# Patient Record
Sex: Female | Born: 1980 | Race: White | Hispanic: No | Marital: Married | Smoking: Former smoker
Health system: Southern US, Community
[De-identification: ages and names within clinical notes are randomized; demographics above are authoritative.]

## PROBLEM LIST (undated history)

## (undated) ENCOUNTER — Ambulatory Visit

## (undated) DIAGNOSIS — G2581 Restless legs syndrome: Secondary | ICD-10-CM

## (undated) DIAGNOSIS — M1990 Unspecified osteoarthritis, unspecified site: Secondary | ICD-10-CM

## (undated) DIAGNOSIS — D649 Anemia, unspecified: Secondary | ICD-10-CM

## (undated) DIAGNOSIS — G5600 Carpal tunnel syndrome, unspecified upper limb: Secondary | ICD-10-CM

## (undated) DIAGNOSIS — R002 Palpitations: Secondary | ICD-10-CM

## (undated) DIAGNOSIS — K219 Gastro-esophageal reflux disease without esophagitis: Secondary | ICD-10-CM

## (undated) HISTORY — PX: CERVIX SURGERY: SHX593

---
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
## 2000-04-03 ENCOUNTER — Other Ambulatory Visit: Admission: RE | Admit: 2000-04-03 | Discharge: 2000-04-03 | Payer: Self-pay | Admitting: Obstetrics & Gynecology

## 2000-04-18 ENCOUNTER — Inpatient Hospital Stay (HOSPITAL_COMMUNITY): Admission: AD | Admit: 2000-04-18 | Discharge: 2000-04-22 | Payer: Self-pay | Admitting: Obstetrics & Gynecology

## 2000-04-20 ENCOUNTER — Encounter: Payer: Self-pay | Admitting: Obstetrics & Gynecology

## 2000-05-08 ENCOUNTER — Ambulatory Visit (HOSPITAL_COMMUNITY): Admission: RE | Admit: 2000-05-08 | Discharge: 2000-05-08 | Payer: Self-pay | Admitting: Obstetrics & Gynecology

## 2000-07-18 ENCOUNTER — Inpatient Hospital Stay (HOSPITAL_COMMUNITY): Admission: AD | Admit: 2000-07-18 | Discharge: 2000-07-20 | Payer: Self-pay | Admitting: Obstetrics and Gynecology

## 2001-02-24 ENCOUNTER — Encounter: Admission: RE | Admit: 2001-02-24 | Discharge: 2001-04-19 | Payer: Self-pay | Admitting: Family Medicine

## 2001-04-20 ENCOUNTER — Emergency Department (HOSPITAL_COMMUNITY): Admission: EM | Admit: 2001-04-20 | Discharge: 2001-04-20 | Payer: Self-pay | Admitting: Emergency Medicine

## 2001-05-04 ENCOUNTER — Emergency Department (HOSPITAL_COMMUNITY): Admission: EM | Admit: 2001-05-04 | Discharge: 2001-05-04 | Payer: Self-pay | Admitting: Emergency Medicine

## 2001-07-07 ENCOUNTER — Ambulatory Visit (HOSPITAL_COMMUNITY): Admission: RE | Admit: 2001-07-07 | Discharge: 2001-07-07 | Payer: Self-pay | Admitting: *Deleted

## 2001-07-07 ENCOUNTER — Encounter: Payer: Self-pay | Admitting: *Deleted

## 2002-11-15 ENCOUNTER — Emergency Department (HOSPITAL_COMMUNITY): Admission: EM | Admit: 2002-11-15 | Discharge: 2002-11-15 | Payer: Self-pay | Admitting: Emergency Medicine

## 2004-07-26 ENCOUNTER — Other Ambulatory Visit: Admission: RE | Admit: 2004-07-26 | Discharge: 2004-07-26 | Payer: Self-pay | Admitting: Family Medicine

## 2006-03-19 ENCOUNTER — Other Ambulatory Visit: Admission: RE | Admit: 2006-03-19 | Discharge: 2006-03-19 | Payer: Self-pay | Admitting: Family Medicine

## 2006-06-23 HISTORY — PX: CERVIX SURGERY: SHX593

## 2006-07-08 ENCOUNTER — Encounter (INDEPENDENT_AMBULATORY_CARE_PROVIDER_SITE_OTHER): Payer: Self-pay | Admitting: *Deleted

## 2006-07-08 ENCOUNTER — Ambulatory Visit (HOSPITAL_COMMUNITY): Admission: RE | Admit: 2006-07-08 | Discharge: 2006-07-08 | Payer: Self-pay | Admitting: Obstetrics and Gynecology

## 2008-01-21 ENCOUNTER — Other Ambulatory Visit: Admission: RE | Admit: 2008-01-21 | Discharge: 2008-01-21 | Payer: Self-pay | Admitting: Obstetrics and Gynecology

## 2009-05-24 ENCOUNTER — Other Ambulatory Visit: Admission: RE | Admit: 2009-05-24 | Discharge: 2009-05-24 | Payer: Self-pay | Admitting: Family Medicine

## 2010-11-08 NOTE — H&P (Signed)
Kendra [name], Kendra [name]             ACCOUNT NO.:  [id number]   MEDICAL RECORD NO.:  [id number]          PATIENT TYPE:  AMB   LOCATION:  SDC                           FACILITY:  [facility]   PHYSICIAN:  Charles A. Delcambre, MDDATE OF BIRTH:  10/10/1980   DATE OF ADMISSION:  DATE OF DISCHARGE:                              HISTORY & PHYSICAL   She is a 30 year old gravida 2, para 2-0-1-2, LMP April 27, 2006,  expecting her period at this time.  Surgery had been scheduled for  May 27, 2006, but this will likely be rescheduled secondary to her  expected period at that time.  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  She was originally referred from Dr. Laurann Montana for  colposcopic biopsy showing high grade dysplasia at 10 o'clock.  Colposcopy was done at the time of LEEP therapy here in the office, but  upon placement of paracervical block, she became intolerant of the  procedure and she is now to be admitted for cervical management under  anesthesia.   PAST MEDICAL HISTORY:  Panic disorder and anxiety.   PAST SURGICAL HISTORY:  C-section and VBAC.   MEDICATIONS:  Lorazepam 0.5 mg p.r.n., used two times per year, and  Diclofenac 75 mg p.r.n., used about two times per month.   ALLERGIES:  ALDARA VERSUS IODINE at the time of colposcopy, mainly felt  to be Aldara reaction.   SOCIAL HISTORY:  One pack per day of cigarettes.  No alcohol or drug  use.  Not sexually active currently.   FAMILY HISTORY:  No major illnesses.   REVIEW OF SYMPTOMS:  Denies fevers, chills, reactions to headaches,  dizziness, chest pain, shortness of breath, wheezing, diarrhea,  constipation, urgency, or frequency, or emotional changes.   PHYSICAL EXAMINATION:  GENERAL:  Alert and oriented x3 in no distress.  VITAL SIGNS:  Blood pressure 110/70, pulse 64, respirations 18, weight  157 pounds, height 5 feet 3 inches.  HEENT:   Grossly within normal limits.  NECK:  Supple without thyromegaly or adenopathy.  LUNGS:  Clear bilaterally.  HEART:  Regular rate and rhythm without murmurs, gallops, and rubs.  ABDOMEN:  Soft, nontender.  PELVIC:  Normal external female genitalia except for multiple less than  3-4 mm condylomata across the perineum, in the perineal body, around the  anus, and up in the superior part of the right labia majora/minora area.  The vagina has a polyp next to the cervix on the left sidewall  approximately 1.5 cm by 0.5 cm.  The cervix is multiparous.  The uterus  is not enlarged.  Adnexa  nontender.  Ovaries normal size and palpable  bilaterally.   ASSESSMENT:  1. CIN2.  2. Vulvar condyloma.  3. Vaginal polyp.   PLAN:  LEEP therapy, polyp resection, and laser vaporization of vulvar  condyloma.  The patient gives informed consent and accepts the risks of  infection, bleeding, bowel and bladder damage.  All questions were  answered.  She understands that condyloma may recur as well  as dysplasia  may recur.  She will remain n.p.o. past midnight the evening prior to  surgery and we will get, at the time of surgery, CBC and urine pregnancy  test.      Bing Neighbors. Sydnee Cabal, MD  Electronically Signed     CAD/MEDQ  D:  05/20/2006  T:  05/20/2006  Job:  [id number]   cc:   Stacie Acres. Cliffton Asters, M.D.  Fax: [phone]

## 2010-11-08 NOTE — Discharge Summary (Signed)
[facility]  Patient:    Kendra [name], Kendra [name]                      MRN: [id number] Adm. Date:  [id number] Disc. Date: [id number] Attending:  Mickle Mallory Dictator:   Leilani Able, P.A.                           Discharge Summary  FINAL DIAGNOSES:              1. Intrauterine pregnancy at [redacted] weeks gestation.                               2. Preterm contractions.  HISTORY OF PRESENT ILLNESS:   This 30 year old G2, P0-1-0-1, presented at ______  weeks with contractions.  The patients cervix was already dilated to 2-3 cm.  The patient had had a history of preterm premature rupture of membranes at 30 weeks with her last pregnancy.  At this point the patient was admitted for magnesium sulfate, Unasyn, and steroid protocol.  HOSPITAL COURSE:              By hospital day #1, the patient was feeling better.  She was not having any contractions.  Ultrasound did show a normal AFI and the cervix to be about 3.2 cm.  The patients hospital course continued to be stable.  She was tapered off her magnesium sulfate on hospital day #2 and was started on oral Procardia 10 mg every eight hours.  By hospital day #3, the patient was still doing well, without any contractions.  On exam, cervix internal os was closed, by Dr. Nita Sells exam, but on the ultrasound it did show that it was 3.2 cm long.  The patient was sent home on a regular diet, told to continue bed rest.  Was given Procardia 10 mg 1 t.i.d.  Follow up in the office in one week and to call if any contractions resumed. DD:  05/13/00 TD:  05/14/00 Job: [id number]

## 2010-11-08 NOTE — H&P (Signed)
Kendra [name], Kendra [name]             ACCOUNT NO.:  [id number]   MEDICAL RECORD NO.:  [id number]          PATIENT TYPE:  AMB   LOCATION:  SDC                           [facility]ACILITY:  [facility]   PHYSICIAN:  Charles A. Delcambre, MDDATE OF BIRTH:  04/23/1981   DATE OF ADMISSION:  07/08/2006  DATE OF DISCHARGE:                              HISTORY & PHYSICAL   This patient is to be admitted on July 08, 2006, to undergo LEEP  therapy and resection of a vaginal polyp.  We previously had planned to  laser condyloma in the vulva but surgery has been rescheduled and at  this point the vulvar condyloma have resolved spontaneously.  We will  therefore change her procedure to eliminate the laser to the vulva.  Dr.  Cliffton Asters initially did colposcopy and biopsy at 10 o'clock showing high  grade dysplasia.  She was to proceed with LEEP therapy here in the  office but upon placement of paracervical block, she became intolerant  of the procedure and she is now to be admitted for cervical dysplasia  management and vaginal polyp management under anesthesia.   PAST MEDICAL HISTORY:  Panic disorder and anxiety.   SURGICAL HISTORY:  1. C-section.  2. VBAC.   MEDICATIONS:  1. Lorazepam 0.5 mg p.r.n., used two times per year she states.  2. Diclofenac 75 mg p.r.n. used about two times per month.   ALLERGIES:  ALDARA VERSUS IODINE AT THE TIME OF COLPOSCOPY MAINLY FELT  TO BE ALDARA REACTION.   SOCIAL HISTORY:  One pack per day of cigarettes.  No alcohol or drug  use.  Not sexually active currently.   FAMILY HISTORY:  No major illnesses.   REVIEW OF SYSTEMS:  Denies fevers, chills, headaches, dizziness, chest  pain, shortness of breath, wheezing, diarrhea, constipation, urgency,  frequency, or emotional changes.  She recently had within the last month  a cold with cough and some diarrhea but these have all since resolved.   PHYSICAL EXAMINATION:  GENERAL:  Alert and oriented x3.  VITAL SIGNS:  Blood  pressure 100/70, heart rate 60, respirations 20,  weight 157.5 pounds, height 5 feet 3 inches.  HEENT:  Grossly within normal limits.  NECK:  Supple without thyromegaly or adenopathy.  LUNGS:  Clear bilaterally.  HEART:  Regular rate and rhythm without murmurs, gallops, or rubs.  ABDOMEN:  Soft and nontender.  PELVIC:  Normal external female genitalia with no condyloma at this  time.  The vagina has a polyp next to the cervix on the left side wall,  approximately 1.5 x 0.5-cm.  The cervix is multiparous.  The uterus is  not enlarged.  Adnexa nontender.  Ovaries normal size and palpable  bilaterally.   ASSESSMENT:  1. CIN-2.  2. Vaginal polyp.   PLAN:  LEEP therapy and polyp resection.  The patient gives informed  consent, accepts risks of infection, bleeding, bowel or bladder damage,  blood product risk including hepatitis and HIV exposure.  All questions  were answered.  She will remain NPO past 0700 on day of surgery with  surgery planned at 1500.  CBC and  urine pregnancy test will be done in  pre-op.      Charles A. Sydnee Cabal, MD  Electronically Signed     CAD/MEDQ  D:  07/06/2006  T:  07/06/2006  Job:  [id number]

## 2010-11-08 NOTE — Op Note (Signed)
NAMEFIORELLA, HANAHAN             ACCOUNT NO.:  [id number]   MEDICAL RECORD NO.:  [id number]          PATIENT TYPE:  AMB   LOCATION:  SDC                           [facility]ILITY:  [facility]   PHYSICIAN:  Charles A. Delcambre, MDDATE OF BIRTH:  [birth year]   DATE OF PROCEDURE:  07/08/2006  DATE OF DISCHARGE:                               OPERATIVE REPORT   PREOPERATIVE DIAGNOSES:  1. Cervical intraepithelial neoplasia II.  2. Vaginal polyp.   POSTOPERATIVE DIAGNOSES:  1. Cervical intraepithelial neoplasia II.  2. Vaginal polyp.   PROCEDURE:  1. Gait conization.  2. Vaginal polyp resection.  3. Paracervical block.   SURGEON:  Charles A. Delcambre, MD   ASSISTANT:  None.   COMPLICATIONS:  None.   ESTIMATED BLOOD LOSS:  Less than 10 cc.   SPECIMENS:  1. Vaginal polyp.  2. LEEP cone specimen labeled on cork.   ANESTHESIA:  Monitored anesthesia care with IV sedation.   COMPLICATIONS:  None.   DESCRIPTION OF PROCEDURE:  Patient was taken to the operating room,  placed in supine position.  She was prepared for anesthesia, placed in  dorsal lithotomy position in universal stirrups.  Anesthesia was given  for IV sedation.  A weighted speculum was placed in the vagina.  Lugol  solution was placed on the cervix.  The lesion could be seen.  The  cervix was bulbous.  Of note, I did expect to have to get the LEEP cone  and several specimens and/or pieces due to the size of the cervix and  not wanting to go too deeply in a single pass.  The paracervical block  was carried out at 3 and 9 o'clock, total of 10 cc injected.  A further  10 cc was injected at 10 and 2 o'clock.  The base of the vaginal polyp  on the left sidewall was injected with 0.25% Marcaine with epinephrine  as well.  Cervical injections with 0.25% Marcaine with epinephrine as  well.  Anesthesia was adequate.  LEEP cone was then taken with 50 watts  cutting and 50 watts coag.  The cone bed was cauterized with bulb  electrode.   Hemostasis was  excellent.  The base of the vaginal polyp was grasped and passing loop  over it, the polyp was resected with the cautery instrument as well.  Hemostasis was excellent.  All instruments were removed.  Patient was  taken to recovery with assistant in attendance, having tolerated  procedure well.      Charles A. Sydnee Cabal, MD  Electronically Signed     CAD/MEDQ  D:  07/08/2006  T:  07/08/2006  Job:  [id number]

## 2015-08-28 ENCOUNTER — Ambulatory Visit (INDEPENDENT_AMBULATORY_CARE_PROVIDER_SITE_OTHER): Payer: PRIVATE HEALTH INSURANCE | Admitting: Physician Assistant

## 2015-08-28 VITALS — BP 128/82 | HR 116 | Temp 97.9°F | Resp 18 | Ht 63.0 in | Wt 220.0 lb

## 2015-08-28 DIAGNOSIS — J101 Influenza due to other identified influenza virus with other respiratory manifestations: Secondary | ICD-10-CM

## 2015-08-28 DIAGNOSIS — R6889 Other general symptoms and signs: Secondary | ICD-10-CM | POA: Diagnosis not present

## 2015-08-28 LAB — POCT INFLUENZA A/B
Influenza A, POC: POSITIVE — AB
Influenza B, POC: NEGATIVE

## 2015-08-28 MED ORDER — HYDROCOD POLST-CPM POLST ER 10-8 MG/5ML PO SUER: 5.0000 mL | Freq: Two times a day (BID) | ORAL | Status: DC | PRN

## 2015-08-28 MED ORDER — GUAIFENESIN ER 1200 MG PO TB12: 1.0000 | ORAL_TABLET | Freq: Two times a day (BID) | ORAL | Status: AC

## 2015-08-28 MED ORDER — OSELTAMIVIR PHOSPHATE 75 MG PO CAPS: 75.0000 mg | ORAL_CAPSULE | Freq: Two times a day (BID) | ORAL | Status: AC

## 2015-08-28 NOTE — Patient Instructions (Signed)
Please push fluids.  Tylenol and Motrin for fever and body aches.    A humidifier can help especially when the air is dry -if you do not have a humidifier you can boil a pot of water on the stove in your home to help with the dry air.  

## 2015-08-28 NOTE — Progress Notes (Signed)
   Kendra [name]  MRN: [id number] DOB: [birth year]  Subjective:  Pt presents to clinic with 2 day h/o cold symptoms but yesterday morning the symptoms worsened and she feels terrible.  She has nasal congestion that started this am and is starting to drip down her throat.  Her cough continues but is dry and coming from her throat. She has a headache and myalgias and is not sleeping well at all.   Home treatment - nyquil and theraflu Sick contacts - family members Flu vaccine - no  There are no active problems to display for this patient.   No current outpatient prescriptions on file prior to visit.   No current facility-administered medications on file prior to visit.    No Known Allergies  Review of Systems  Constitutional: Positive for fever (Tmax 100.5) and chills.  HENT: Positive for congestion (just started today), postnasal drip and rhinorrhea. Negative for sore throat.   Respiratory: Positive for cough (clear). Negative for shortness of breath and wheezing.   Gastrointestinal: Negative.   Musculoskeletal: Positive for myalgias.  Neurological: Positive for headaches.   Objective:  BP 128/82 mmHg  Pulse 116  Temp(Src) 97.9 F (36.6 C) (Oral)  Resp 18  Ht 5\' 3"  (1.6 m)  Wt 220 lb (99.791 kg)  BMI 38.98 kg/m2  SpO2 98%  LMP 08/01/2015  Physical Exam  Constitutional: She is oriented to person, place, and time and well-developed, well-nourished, and in no distress.  HENT:  Head: Normocephalic and atraumatic.  Right Ear: Hearing, tympanic membrane, external ear and ear canal normal.  Left Ear: Hearing, tympanic membrane, external ear and ear canal normal.  Nose: Nose normal.  Mouth/Throat: Uvula is midline, oropharynx is clear and moist and mucous membranes are normal.  Eyes: Conjunctivae are normal.  Neck: Normal range of motion.  Cardiovascular: Normal rate, regular rhythm and normal heart sounds.   No murmur heard. Pulmonary/Chest: Effort normal and breath  sounds normal.  Neurological: She is alert and oriented to person, place, and time. Gait normal.  Skin: Skin is warm and dry.  Psychiatric: Mood, memory, affect and judgment normal.  Vitals reviewed.   Results for orders placed or performed in visit on 08/28/15  POCT Influenza A/B  Result Value Ref Range   Influenza A, POC Positive (A) Negative   Influenza B, POC Negative Negative    Assessment and Plan :  Flu-like symptoms - Plan: POCT Influenza A/B  Influenza A - Plan: Guaifenesin (MUCINEX MAXIMUM STRENGTH) 1200 MG TB12, chlorpheniramine-HYDROcodone (TUSSIONEX PENNKINETIC ER) 10-8 MG/5ML SUER, oseltamivir (TAMIFLU) 75 MG capsule, Care order/instruction   Symptomatic care d/w pt.  Benny LennertSarah Weber PA-C  [facility] 08/28/2015 12:42 PM

## 2016-01-08 ENCOUNTER — Encounter: Payer: Self-pay | Admitting: Family Medicine

## 2016-01-08 ENCOUNTER — Ambulatory Visit (INDEPENDENT_AMBULATORY_CARE_PROVIDER_SITE_OTHER): Payer: PRIVATE HEALTH INSURANCE | Admitting: Family Medicine

## 2016-01-08 VITALS — BP 125/84 | HR 76 | Resp 19 | Ht 62.25 in | Wt 220.0 lb

## 2016-01-08 DIAGNOSIS — K219 Gastro-esophageal reflux disease without esophagitis: Secondary | ICD-10-CM | POA: Insufficient documentation

## 2016-01-08 DIAGNOSIS — A630 Anogenital (venereal) warts: Secondary | ICD-10-CM | POA: Insufficient documentation

## 2016-01-08 DIAGNOSIS — K5909 Other constipation: Secondary | ICD-10-CM

## 2016-01-08 DIAGNOSIS — A6000 Herpesviral infection of urogenital system, unspecified: Secondary | ICD-10-CM

## 2016-01-08 DIAGNOSIS — K589 Irritable bowel syndrome without diarrhea: Secondary | ICD-10-CM

## 2016-01-08 DIAGNOSIS — Z8742 Personal history of other diseases of the female genital tract: Secondary | ICD-10-CM | POA: Insufficient documentation

## 2016-01-08 DIAGNOSIS — Z862 Personal history of diseases of the blood and blood-forming organs and certain disorders involving the immune mechanism: Secondary | ICD-10-CM

## 2016-01-08 DIAGNOSIS — F17200 Nicotine dependence, unspecified, uncomplicated: Secondary | ICD-10-CM

## 2016-01-08 DIAGNOSIS — B977 Papillomavirus as the cause of diseases classified elsewhere: Secondary | ICD-10-CM | POA: Diagnosis not present

## 2016-01-08 DIAGNOSIS — K5900 Constipation, unspecified: Secondary | ICD-10-CM | POA: Insufficient documentation

## 2016-01-08 MED ORDER — RANITIDINE HCL 150 MG PO TABS: ORAL_TABLET | ORAL | Status: DC

## 2016-01-08 NOTE — Assessment & Plan Note (Signed)
Patient not sure she is ready to quit smoking but we discussed medications to aid her, advised she go to the American heart association website and read about smoking cessation and tobacco use. She will read through this and we will discuss what her thoughts are and what she feel would be the best options for her to quit in the near future.

## 2016-01-08 NOTE — Assessment & Plan Note (Addendum)
Would like patient to keep a food diary and when her symptoms occur right it down. The future we will discuss 5 map diets and other possible treatments. Touched upon importance of fiber and increasing water intake as well as exercise to improve bowel movement.  Handouts provided

## 2016-01-08 NOTE — Assessment & Plan Note (Signed)
Patient night determine that is best to obtain some labs in the next office visit discuss her goals and possible plans of how to achieve them.

## 2016-01-08 NOTE — Assessment & Plan Note (Signed)
Was not aware of foods and dietary changes to prevent reflux. This was reviewed with patient today and handouts were provided.

## 2016-01-08 NOTE — Progress Notes (Signed)
Kendra [name], D.O. Primary care at [facility]   Subjective:    Chief Complaint  Patient presents with  . Establish Care   New pt, here to establish care.   HPI: Kendra [name] is a pleasant 35 y.o. female who presents to [facility] today to Become established.    Her review of systems is negative but she would like to discuss smoking cess, her weight, sleep, IBS Sx today  It's been many, many years and she's been to a doctor other than occasional urgent care if she 6.  She's been married 7 years and is monogamous with her husband. They both test positive for genital warts.  Patient does not have an outbreak but husband does several times a year.    Tobacco abuse:  1/2-1 pack a day for 13 years. Not ready to quit- or she's not sure if she has but would like to further discuss all of her options  Fe def  during 1st pregnancy- has been ever since she thinks-but blod work in long time.   No GYN.  Pap- done many many yrs ago.   LEEP procedure- for ABN cells; back in 2007.    IBS-   sx comes and goes, much more consitpation then D, occ N, no V. BLoating, occ crampy abd pain. Going on many yrs.  Never sought medical help.  GERD-  ranititdine most days, occ bid.  No special diet- not sure what she can and cannot eat.     morbidly obese:  Patient states she's been obese most of her life and this she finds very upsetting. She is worried that she possibly has lab abnormalities causing this such as thyroid abnormalities she would like lab work today    History reviewed. No pertinent past medical history.   Past Surgical History  Procedure Laterality Date  . Cesarean section    . Cervix surgery      removal of polyps and abnormal cells    Family History  Problem Relation Age of Onset  . Hyperlipidemia Father   . Hypertension Father   . Cancer Maternal Grandmother     Lung and Brain  . Cancer Maternal Grandfather     lung  .  Hyperlipidemia Maternal Grandfather   . Diabetes Paternal Grandmother   . Diabetes Paternal Grandfather   . Hyperlipidemia Paternal Grandfather       History  Drug Use No  ,    History  Alcohol Use: Not on file  ,    History  Smoking status  . Current Every Day Smoker -- 0.50 packs/day for 17 years  Smokeless tobacco  . Never Used  ,     History  Sexual Activity  . Sexual Activity: Yes      Patient's Medications  New Prescriptions   No medications on file  Previous Medications   IBUPROFEN (ADVIL,MOTRIN) 400 MG TABLET    Take 400 mg by mouth every 6 (six) hours as needed.   IRON, FERROUS SULFATE, PO    Take by mouth.   VITAMIN C (ASCORBIC ACID) 500 MG TABLET    Take 500 mg by mouth daily.  Modified Medications   No medications on file  Discontinued Medications   CHLORPHENIRAMINE-HYDROCODONE (TUSSIONEX PENNKINETIC ER) 10-8 MG/5ML SUER    Take 5 mLs by mouth 2 (two) times daily as needed for cough.     Review of patient's allergies indicates no known  allergies. Outpatient Encounter Prescriptions as of 01/08/2016  Medication Sig  . ibuprofen (ADVIL,MOTRIN) 400 MG tablet Take 400 mg by mouth every 6 (six) hours as needed.  . IRON, FERROUS SULFATE, PO Take by mouth.  . vitamin C (ASCORBIC ACID) 500 MG tablet Take 500 mg by mouth daily.  . [DISCONTINUED] chlorpheniramine-HYDROcodone (TUSSIONEX PENNKINETIC ER) 10-8 MG/5ML SUER Take 5 mLs by mouth 2 (two) times daily as needed for cough.   No facility-administered encounter medications on file as of 01/08/2016.    Fall Risk  08/28/2015  Falls in the past year? No     Depression screen [facility] 08/28/2015  Decreased Interest 0  PHQ - 2 Score 0      Review of Systems:   ( Completed via Adult Medical History Intake form today ) General:   Denies fever, chills, appetite changes, unexplained weight loss.  Optho/Auditory:   Denies visual changes, blurred vision/LOV, ringing in ears/ diff hearing Respiratory:    Denies SOB, DOE, cough, wheezing.  Cardiovascular:   Denies chest pain, palpitations, new onset peripheral edema  Gastrointestinal:   Denies nausea, vomiting, diarrhea.  Genitourinary:    Denies dysuria, increased frequency, flank pain.  Endocrine:     Denies hot or cold intolerance, polyuria, polydipsia. Musculoskeletal:  Denies unexplained myalgias, joint swelling, arthralgias, gait problems.  Skin:  Denies rash, suspicious lesions or new/ changes in moles Neurological:    Denies dizziness, syncope, unexplained weakness, lightheadedness, numbness  Psychiatric/Behavioral:   Denies mood changes, suicidal or homicidal ideations, hallucinations    Objective:   Blood pressure 125/84, pulse 76, resp. rate 19, height 5' 2.25" (1.581 m), weight 220 lb (99.791 kg), SpO2 99 %. Body mass index is 39.92 kg/(m^2).  General: Well Developed, well nourished, and in no acute distress.  Neuro: Alert and oriented x3, extra-ocular muscles intact, sensation grossly intact.  HEENT: Normocephalic, atraumatic, pupils equal round reactive to light, neck supple, no gross masses, no carotid bruits, no JVD apprec Skin: no gross suspicious lesions or rashes  Cardiac: Regular rate and rhythm, no murmurs rubs or gallops.  Respiratory: Essentially clear to auscultation bilaterally. Not using accessory muscles, speaking in full sentences.  Abdominal: Soft, not grossly distended Musculoskeletal: Ambulates w/o diff, FROM * 4 ext.  Vasc: less 2 sec cap RF, warm and pink  Psych:  No HI/SI, judgement and insight good.    Impression and Recommendations:    The patient was counseled, risk factors were discussed, anticipatory guidance given.  Irritable bowel syndrome (IBS) Would like patient to keep a food diary and when her symptoms occur right it down. The future we will discuss 5 map diets and other possible treatments. Touched upon importance of fiber and increasing water intake as well as exercise to improve bowel  movement.  Handouts provided  GERD (gastroesophageal reflux disease) Was not aware of foods and dietary changes to prevent reflux. This was reviewed with patient today and handouts were provided.  Morbidly obese (HCC) Patient night determine that is best to obtain some labs in the next office visit discuss her goals and possible plans of how to achieve them.  Tobacco use disorder Patient not sure she is ready to quit smoking but we discussed medications to aid her, advised she go to the American heart association website and read about smoking cessation and tobacco use. She will read through this and we will discuss what her thoughts are and what she feel would be the best options for her to quit in the  near future.   I did not order these below meds, they were just recorded by the CMA today as patient is taking them regularly Meds ordered this encounter  Medications  . IRON, FERROUS SULFATE, PO    Sig: Take by mouth.  . vitamin C (ASCORBIC ACID) 500 MG tablet    Sig: Take 500 mg by mouth daily.    Gross side effects, risk and benefits, and alternatives of medications discussed with patient.  Patient is aware that all medications have potential side effects and we are unable to predict every side effect or drug-drug interaction that may occur.  Expresses verbal understanding and consents to current therapy plan and treatment regimen.  Please see AVS handed out to patient at the end of our visit for further patient instructions/ counseling done pertaining to today's office visit.   Orders Placed This Encounter  Procedures  . CBC with Differential/Platelet  . COMPLETE METABOLIC PANEL WITH GFR  . Lipid panel  . VITAMIN D 25 Hydroxy (Vit-D Deficiency, Fractures)  . Hemoglobin A1c  . TSH    Note: This document was prepared using Dragon voice recognition software and may include unintentional dictation errors.

## 2016-01-08 NOTE — Patient Instructions (Addendum)
Take your ranitidine daily for your heartburn\reflex     Keep a track of exactly what you eat and what when your symptoms are.       Food Choices for Gastroesophageal Reflux Disease, Adult When you have gastroesophageal reflux disease (GERD), the foods you eat and your eating habits are very important. Choosing the right foods can help ease the discomfort of GERD. WHAT GENERAL GUIDELINES DO I NEED TO FOLLOW?  Choose fruits, vegetables, whole grains, low-fat dairy products, and low-fat meat, fish, and poultry.  Limit fats such as oils, salad dressings, butter, nuts, and avocado.  Keep a food diary to identify foods that cause symptoms.  Avoid foods that cause reflux. These may be different for different people.  Eat frequent small meals instead of three large meals each day.  Eat your meals slowly, in a relaxed setting.  Limit fried foods.  Cook foods using methods other than frying.  Avoid drinking alcohol.  Avoid drinking large amounts of liquids with your meals.  Avoid bending over or lying down until 2-3 hours after eating. WHAT FOODS ARE NOT RECOMMENDED? The following are some foods and drinks that may worsen your symptoms: Vegetables Tomatoes. Tomato juice. Tomato and spaghetti sauce. Chili peppers. Onion and garlic. Horseradish. Fruits Oranges, grapefruit, and lemon (fruit and juice). Meats High-fat meats, fish, and poultry. This includes hot dogs, ribs, ham, sausage, salami, and bacon. Dairy Whole milk and chocolate milk. Sour cream. Cream. Butter. Ice cream. Cream cheese.  Beverages Coffee and tea, with or without caffeine. Carbonated beverages or energy drinks. Condiments Hot sauce. Barbecue sauce.  Sweets/Desserts Chocolate and cocoa. Donuts. Peppermint and spearmint. Fats and Oils High-fat foods, including [place] fries and potato chips. Other Vinegar. Strong spices, such as black pepper, white pepper, red pepper, cayenne, curry powder, cloves, ginger,  and chili powder. The items listed above may not be a complete list of foods and beverages to avoid. Contact your dietitian for more information.   This information is not intended to replace advice given to you by your health care provider. Make sure you discuss any questions you have with your health care provider.   Document Released: 06/09/2005 Document Revised: 06/30/2014 Document Reviewed: 04/13/2013 Elsevier Interactive Patient Education 2016 Elsevier Inc.        Diet for Irritable Bowel Syndrome When you have irritable bowel syndrome (IBS), the foods you eat and your eating habits are very important. IBS may cause various symptoms, such as abdominal pain, constipation, or diarrhea. Choosing the right foods can help ease discomfort caused by these symptoms. Work with your health care provider and dietitian to find the best eating plan to help control your symptoms. WHAT GENERAL GUIDELINES DO I NEED TO FOLLOW?  Keep a food diary. This will help you identify foods that cause symptoms. Write down:  What you eat and when.  What symptoms you have.  When symptoms occur in relation to your meals.  Avoid foods that cause symptoms. Talk with your dietitian about other ways to get the same nutrients that are in these foods.  Eat more foods that contain fiber. Take a fiber supplement if directed by your dietitian.  Eat your meals slowly, in a relaxed setting.  Aim to eat 5-6 small meals per day. Do not skip meals.  Drink enough fluids to keep your urine clear or pale yellow.  Ask your health care provider if you should take an over-the-counter probiotic during flare-ups to help restore healthy gut bacteria.  If you have  cramping or diarrhea, try making your meals low in fat and high in carbohydrates. Examples of carbohydrates are pasta, rice, whole grain breads and cereals, fruits, and vegetables.  If dairy products cause your symptoms to flare up, try eating less of them. You  might be able to handle yogurt better than other dairy products because it contains bacteria that help with digestion. WHAT FOODS ARE NOT RECOMMENDED? The following are some foods and drinks that may worsen your symptoms:  Fatty foods, such as [place] fries.  Milk products, such as cheese or ice cream.  Chocolate.  Alcohol.  Products with caffeine, such as coffee.  Carbonated drinks, such as soda. The items listed above may not be a complete list of foods and beverages to avoid. Contact your dietitian for more information. WHAT FOODS ARE GOOD SOURCES OF FIBER? Your health care provider or dietitian may recommend that you eat more foods that contain fiber. Fiber can help reduce constipation and other IBS symptoms. Add foods with fiber to your diet a little at a time so that your body can get used to them. Too much fiber at once might cause gas and swelling of your abdomen. The following are some foods that are good sources of fiber:  Apples.  Peaches.  Pears.  Berries.  Figs.  Broccoli (raw).  Cabbage.  Carrots.  Raw peas.  Kidney beans.  Lima beans.  Whole grain bread.  Whole grain cereal. FOR MORE INFORMATION  International Foundation for Functional Gastrointestinal Disorders: www.iffgd.[employer] Diabetes and Digestive and Kidney Diseases: http://norris-lawson.com/.aspx   This information is not intended to replace advice given to you by your health care provider. Make sure you discuss any questions you have with your health care provider.   Document Released: 08/30/2003 Document Revised: 06/30/2014 Document Reviewed: 09/09/2013 Elsevier Interactive Patient Education [employer]

## 2016-01-16 ENCOUNTER — Other Ambulatory Visit: Payer: PRIVATE HEALTH INSURANCE

## 2016-01-23 ENCOUNTER — Other Ambulatory Visit (INDEPENDENT_AMBULATORY_CARE_PROVIDER_SITE_OTHER): Payer: PRIVATE HEALTH INSURANCE

## 2016-01-23 DIAGNOSIS — K5909 Other constipation: Secondary | ICD-10-CM

## 2016-01-23 DIAGNOSIS — K589 Irritable bowel syndrome without diarrhea: Secondary | ICD-10-CM

## 2016-01-23 DIAGNOSIS — Z862 Personal history of diseases of the blood and blood-forming organs and certain disorders involving the immune mechanism: Secondary | ICD-10-CM | POA: Diagnosis not present

## 2016-01-23 LAB — POCT GLYCOSYLATED HEMOGLOBIN (HGB A1C): Hemoglobin A1C: 5.6

## 2016-01-24 LAB — CBC WITH DIFFERENTIAL/PLATELET
BASOS PCT: 1 %
Basophils Absolute: 83 cells/uL (ref 0–200)
EOS ABS: 166 {cells}/uL (ref 15–500)
Eosinophils Relative: 2 %
HEMATOCRIT: 42.7 % (ref 35.0–45.0)
HEMOGLOBIN: 14 g/dL (ref 11.7–15.5)
LYMPHS ABS: 2075 {cells}/uL (ref 850–3900)
LYMPHS PCT: 25 %
MCH: 29.2 pg (ref 27.0–33.0)
MCHC: 32.8 g/dL (ref 32.0–36.0)
MCV: 89 fL (ref 80.0–100.0)
MONO ABS: 664 {cells}/uL (ref 200–950)
MPV: 12.3 fL (ref 7.5–12.5)
Monocytes Relative: 8 %
NEUTROS ABS: 5312 {cells}/uL (ref 1500–7800)
Neutrophils Relative %: 64 %
PLATELETS: 217 10*3/uL (ref 140–400)
RBC: 4.8 MIL/uL (ref 3.80–5.10)
RDW: 13.8 % (ref 11.0–15.0)
WBC: 8.3 10*3/uL (ref 3.8–10.8)

## 2016-01-24 LAB — VITAMIN D 25 HYDROXY (VIT D DEFICIENCY, FRACTURES): Vit D, 25-Hydroxy: 19 ng/mL — ABNORMAL LOW (ref 30–100)

## 2016-01-24 LAB — COMPLETE METABOLIC PANEL WITH GFR
ALT: 15 U/L (ref 6–29)
AST: 16 U/L (ref 10–30)
Albumin: 3.7 g/dL (ref 3.6–5.1)
Alkaline Phosphatase: 97 U/L (ref 33–115)
BUN: 12 mg/dL (ref 7–25)
CHLORIDE: 107 mmol/L (ref 98–110)
CO2: 23 mmol/L (ref 20–31)
CREATININE: 0.75 mg/dL (ref 0.50–1.10)
Calcium: 9.1 mg/dL (ref 8.6–10.2)
GFR, Est Non African American: >89 mL/min (ref >=60)
Glucose, Bld: 93 mg/dL (ref 65–99)
Potassium: 4.9 mmol/L (ref 3.5–5.3)
Sodium: 137 mmol/L (ref 135–146)
Total Bilirubin: 0.3 mg/dL (ref 0.2–1.2)
Total Protein: 6.9 g/dL (ref 6.1–8.1)

## 2016-01-24 LAB — LIPID PANEL
Cholesterol: 178 mg/dL (ref 125–200)
HDL: 41 mg/dL — AB (ref >=46)
LDL CALC: 101 mg/dL (ref <130)
TRIGLYCERIDES: 182 mg/dL — AB (ref <150)
Total CHOL/HDL Ratio: 4.3 Ratio (ref <=5.0)
VLDL: 36 mg/dL — AB (ref <30)

## 2016-01-24 LAB — TSH: TSH: 1.7 mIU/L

## 2016-01-26 NOTE — Progress Notes (Signed)
Please inform patient that there are abnormal lab values that we need discuss in person and devise a game plan together of how to improve them.  This should be done in the very near future.   Please have him/her make a follow-up appointment with me at their earliest convenience if they don't already have one.  Thanks, Dr Val Eagle

## 2016-02-04 ENCOUNTER — Encounter: Payer: Self-pay | Admitting: Family Medicine

## 2016-02-04 ENCOUNTER — Ambulatory Visit (INDEPENDENT_AMBULATORY_CARE_PROVIDER_SITE_OTHER): Payer: PRIVATE HEALTH INSURANCE | Admitting: Family Medicine

## 2016-02-04 VITALS — BP 128/89 | HR 80 | Wt 221.4 lb

## 2016-02-04 DIAGNOSIS — E786 Lipoprotein deficiency: Secondary | ICD-10-CM

## 2016-02-04 DIAGNOSIS — R0602 Shortness of breath: Secondary | ICD-10-CM | POA: Diagnosis not present

## 2016-02-04 DIAGNOSIS — F17200 Nicotine dependence, unspecified, uncomplicated: Secondary | ICD-10-CM

## 2016-02-04 DIAGNOSIS — R5383 Other fatigue: Secondary | ICD-10-CM

## 2016-02-04 DIAGNOSIS — E781 Pure hyperglyceridemia: Secondary | ICD-10-CM

## 2016-02-04 DIAGNOSIS — R002 Palpitations: Secondary | ICD-10-CM

## 2016-02-04 DIAGNOSIS — Z862 Personal history of diseases of the blood and blood-forming organs and certain disorders involving the immune mechanism: Secondary | ICD-10-CM

## 2016-02-04 DIAGNOSIS — R5381 Other malaise: Secondary | ICD-10-CM

## 2016-02-04 DIAGNOSIS — E559 Vitamin D deficiency, unspecified: Secondary | ICD-10-CM

## 2016-02-04 MED ORDER — VITAMIN D3 125 MCG (5000 UT) PO TABS: ORAL_TABLET | ORAL | 3 refills | Status: AC

## 2016-02-04 MED ORDER — VITAMIN D (ERGOCALCIFEROL) 1.25 MG (50000 UNIT) PO CAPS: 50000.0000 [IU] | ORAL_CAPSULE | ORAL | 10 refills | Status: DC

## 2016-02-04 NOTE — Progress Notes (Signed)
Assessment and plan:  1. Subjective Heart Palpitations   2. Other fatigue   3. SOB (shortness of breath)   4. 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 Vitamin D deficiency   6. Tobacco use disorder   7. Morbid obesity, unspecified obesity type ([place])   8. 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 We will recheck her cholesterol in 1 year  Handouts provided  Vitamin D deficiency Supplementation recommended  Education provided  Tobacco use disorder Advised to go to: https://wilkins.info/  Not sure she is ready to quit.  Education done and handouts provided   Palpitations Patient not very reassured by normal EKG in the office today which I reviewed with her.  Advised to avoid stimulants such as caffeine, nicotine   After risks benefits and discussion of possible further workup, patient wants a 48 hour Holter monitor.  She understands it may be difficult to capture irregular heartbeats during limited time period but still would like the evaluation.  Discussed stress management and deep breathing techniques to do when she has these feelings.  Other fatigue Reassured that all recent lab work, including TSH, looks good with the exception of what is noted.  She understands is no laboratory reason for her fatigue  We discussed the physiologic effects of deconditioning and morbid obesity on the body.   Counseling done. Advised lifestyle modifications   SOB (shortness of breath) Patient not stressed anymore than usual, denies feeling physical stress symptoms.  I did explain that feeling heart racing/  shortness of breath etc can be symptoms of anxiety.   Advised she  keep a log of her current emotions at the time she has the "palpitations", to see possible relation between the emotional and physical.   History of iron deficiency anemia Stable, no signs iron deficiency anemia.   Recommend to recheck vitamin D and hemoglobin A1c in 6 months  Patient's Medications  New Prescriptions   CHOLECALCIFEROL (VITAMIN D3) 5000 UNITS TABS    5,000 IU OTC vitamin D3 daily.   VITAMIN D, ERGOCALCIFEROL, (DRISDOL) 50000 UNITS CAPS CAPSULE    Take 1 capsule (50,000 Units total) by mouth every 7 (seven) days.  Previous Medications   IBUPROFEN (ADVIL,MOTRIN) 400 MG TABLET    Take 400 mg by mouth every 6 (six) hours as needed.   IRON, FERROUS SULFATE, PO    Take by mouth.   RANITIDINE (ZANTAC) 150 MG TABLET    150 mg twice daily - reflux   VITAMIN C (ASCORBIC ACID) 500 MG TABLET    Take 500 mg by mouth daily.  Modified Medications   No medications on file  Discontinued Medications   No medications on file    Return in about 6 months (around 08/06/2016).  Anticipatory guidance and routine counseling done re: condition, txmnt options and need for follow up. All questions of patient's were answered.   Gross side effects, risk and benefits, and alternatives of medications discussed with patient.  Patient is aware that all medications have potential side effects and we are unable to predict every sideeffect or drug-drug interaction that may occur.  Expresses verbal understanding and consents to current therapy plan and treatment regiment.  Please see AVS handed out to patient at the end of our visit for  additional patient instructions/ counseling done pertaining to today's office visit.  Note: This document was prepared using Dragon voice recognition software and may include unintentional dictation errors.   ----------------------------------------------------------------------------------------------------------------------  Subjective:   CC: Kendra [name] is a  35 y.o. female who presents to [place] at [facility] today for c/o palpitations- started 8/7 when she awoke in the am's.  Felt fluttering/ like butterfly in chest-  Lasted all day.  Had SOB with it, no CP, skin/body felt like it was burning, felt tired and like she was going to fade out.   Pulse during the episode was 62.  Has had little spells like this in the past= lasting on ly 1 hour or so.  Pt was not scared/ anxious etc  Drinks 1.5 c coffee in am and 1 c in evenings.  No other caffeine through the day.   Pt meditates- and deals with her anxiety well through self awareness and meditation.   Here/ scheduled for for lab review today      Wt Readings from Last 3 Encounters:  02/04/16 221 lb 6.4 oz (100.4 kg)  01/08/16 220 lb (99.8 kg)  08/28/15 220 lb (99.8 kg)   BP Readings from Last 3 Encounters:  02/04/16 128/89  01/08/16 125/84  08/28/15 128/82   Pulse Readings from Last 3 Encounters:  02/04/16 80  01/08/16 76  08/28/15 (!) 116      Full medical history updated and reviewed in the office today  Patient Active Problem List   Diagnosis Date Noted  . 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 Vitamin D deficiency 02/09/2016  . Palpitations 02/09/2016  . Other fatigue 02/09/2016  . SOB (shortness of breath) 02/09/2016  . Genital herpes 01/08/2016  . HPV (human papilloma virus) anogenital infection 01/08/2016  . Irritable bowel syndrome (IBS) 01/08/2016  . GERD (gastroesophageal reflux disease) 01/08/2016  . Morbidly obese ([place]) 01/08/2016  . Constipation 01/08/2016  . Tobacco use disorder 01/08/2016  . History of abn Pap- 2007 01/08/2016  . History of iron deficiency anemia 01/08/2016    History reviewed. No pertinent past medical history.  Past Surgical History:  Procedure Laterality Date  . CERVIX SURGERY     removal of polyps and abnormal cells  . CESAREAN SECTION      Social  History  Substance Use Topics  . Smoking status: Current Every Day Smoker    Packs/day: 0.50    Years: 17.00  . Smokeless tobacco: Never Used  . Alcohol use Not on file    family history includes Cancer in her maternal grandfather and maternal grandmother; Diabetes in her paternal grandfather and paternal grandmother; Hyperlipidemia in her father, maternal grandfather, and paternal grandfather; Hypertension in her father.   Medications: Current Outpatient Prescriptions  Medication Sig Dispense Refill  . ibuprofen (ADVIL,MOTRIN) 400 MG tablet Take 400 mg by mouth every 6 (six) hours as needed.    . IRON, FERROUS SULFATE, PO Take by mouth.    . ranitidine (ZANTAC) 150 MG tablet 150 mg twice daily - reflux 180 tablet 1  . vitamin C (ASCORBIC ACID) 500 MG tablet Take 500 mg by mouth daily.    . Cholecalciferol (VITAMIN D3) 5000 units TABS 5,000 IU OTC vitamin D3 daily. 90 tablet 3  . Vitamin D, Ergocalciferol, (DRISDOL) 50000 units CAPS capsule Take 1 capsule (50,000 Units total) by mouth every 7 (seven) days. 12 capsule 10   No current facility-administered medications for this visit.  Allergies:  No Known Allergies   ROS:  Const:    no fevers, chills, + fatigued w episodes Eyes:    conjunctiva clear, no vision changes or blurred vision ENT:  no hearing difficulties, no dysphagia, no dysphonia, no nose bleeds CV:   + Palpitations\flutteringinchest no chest pain, some shortness of breath with episodes, no inc dyspnea on exertion Pulm:   no SOB at rest or exertion, no Wheeze, no DIB, no hemoptysis GI:    no N/V/D/C, no abd pain GU:   no blood in urine or inc freq or urgency Heme/Onc:    no unexplained bleeding, no night sweats, no more fatigue than usual Neuro:   No dizziness, no LOC, No unexplained weakness or numbness Endo:   no unexplained wt loss or gain M-Sk:   no localized myalgias or arthralgias Psych:    No SI/HI, no memory prob or unexplained  confusion    Objective:  Blood pressure 128/89, pulse 80, weight 221 lb 6.4 oz (100.4 kg), last menstrual period 01/29/2016. Body mass index is 40.17 kg/m.  Gen: Well NAD, A and O *3 HEENT: [place]/AT, EOMI,  MMM, OP- clr Lungs: Normal work of breathing. CTA B/L, no Wh, rhonchi Heart: RRR, S1, S2 WNL's, no MRG Abd: Soft. No gross distention Exts: warm, pink,  Brisk capillary refill, warm and well perfused.    Recent Results (from the past 2160 hour(s))  CBC with Differential/Platelet     Status: None   Collection Time: 01/23/16  8:40 AM  Result Value Ref Range   WBC 8.3 3.8 - 10.8 K/uL   RBC 4.80 3.80 - 5.10 MIL/uL   Hemoglobin 14.0 11.7 - 15.5 g/dL   HCT 42.7 35.0 - 45.0 %   MCV 89.0 80.0 - 100.0 fL   MCH 29.2 27.0 - 33.0 pg   MCHC 32.8 32.0 - 36.0 g/dL   RDW 13.8 11.0 - 15.0 %   Platelets 217 140 - 400 K/uL   MPV 12.3 7.5 - 12.5 fL   Neutro Abs 5,312 1,500 - 7,800 cells/uL   Lymphs Abs 2,075 850 - 3,900 cells/uL   Monocytes Absolute 664 200 - 950 cells/uL   Eosinophils Absolute 166 15 - 500 cells/uL   Basophils Absolute 83 0 - 200 cells/uL   Neutrophils Relative % 64 %   Lymphocytes Relative 25 %   Monocytes Relative 8 %   Eosinophils Relative 2 %   Basophils Relative 1 %   Smear Review Criteria for review not met     Comment: ** Please note change in unit of measure and reference range(s). **  COMPLETE METABOLIC PANEL WITH GFR     Status: None   Collection Time: 01/23/16  8:40 AM  Result Value Ref Range   Sodium 137 135 - 146 mmol/L   Potassium 4.9 3.5 - 5.3 mmol/L   Chloride 107 98 - 110 mmol/L   CO2 23 20 - 31 mmol/L   Glucose, Bld 93 65 - 99 mg/dL   BUN 12 7 - 25 mg/dL   Creat 0.75 0.50 - 1.10 mg/dL   Total Bilirubin 0.3 0.2 - 1.2 mg/dL   Alkaline Phosphatase 97 33 - 115 U/L   AST 16 10 - 30 U/L   ALT 15 6 - 29 U/L   Total Protein 6.9 6.1 - 8.1 g/dL   Albumin 3.7 3.6 - 5.1 g/dL   Calcium 9.1 8.6 - 10.2 mg/dL   GFR, Est African American >89 >=60 mL/min    GFR,  Est Non African American >89 >=60 mL/min  Lipid panel     Status: Abnormal   Collection Time: 01/23/16  8:40 AM  Result Value Ref Range   Cholesterol 178 125 - 200 mg/dL   Triglycerides 182 (H) <150 mg/dL   HDL 41 (L) >=46 mg/dL   Total CHOL/HDL Ratio 4.3 <=5.0 Ratio   VLDL 36 (H) <30 mg/dL   LDL Cholesterol 101 <130 mg/dL    Comment:   Total Cholesterol/HDL Ratio:CHD Risk                        Coronary Heart Disease Risk Table                                        Men       Women          1/2 Average Risk              3.4        3.3              Average Risk              5.0        4.4           2X Average Risk              9.6        7.1           3X Average Risk             23.4       11.0 Use the calculated Patient Ratio above and the CHD Risk table  to determine the patient's CHD Risk.   VITAMIN D 25 Hydroxy (Vit-D Deficiency, Fractures)     Status: Abnormal   Collection Time: 01/23/16  8:40 AM  Result Value Ref Range   Vit D, 25-Hydroxy 19 (L) 30 - 100 ng/mL    Comment: Vitamin D Status           25-OH Vitamin D        Deficiency                <20 ng/mL        Insufficiency         20 - 29 ng/mL        Optimal             > or = 30 ng/mL   For 25-OH Vitamin D testing on patients on D2-supplementation and patients for whom quantitation of D2 and D3 fractions is required, the [facility] 25-OH VIT D, (D2,D3), LC/MS/MS is recommended: order code [id number] (patients > 2 yrs).   TSH     Status: None   Collection Time: 01/23/16  8:40 AM  Result Value Ref Range   TSH 1.70 mIU/L    Comment:   Reference Range   > or = 20 Years  0.40-4.50   Pregnancy Range First trimester  0.26-2.66 Second trimester 0.55-2.73 Third trimester  0.43-2.91     POCT glycosylated hemoglobin (Hb A1C)     Status: Normal   Collection Time: 01/23/16  9:37 AM  Result Value Ref Range   Hemoglobin A1C 5.6

## 2016-02-04 NOTE — Patient Instructions (Addendum)
30 minutes of aerobic activity of moderate intensity daily and please follow a low saturated and Transfats diet as below. We will recheck her cholesterol in 1 year;   Recommend to recheck vitamin D and hemoglobin A1c in 6 months  Go to this website below and please do some extensive reading on it. If you become emotionally invested in it and spend time thinking about quitting and how to quit, you are much more likely to quit. MB successful with it. Also use the quit lines for counseling help and support.  [employer]   Smoking Cessation, Tips for Success 1-800-quitnow 1800-quit4life If you are ready to quit smoking, congratulations! You have chosen to help yourself be healthier. Cigarettes bring nicotine, tar, carbon monoxide, and other irritants into your body. Your lungs, heart, and blood vessels will be able to work better without these poisons. There are many different ways to quit smoking. Nicotine gum, nicotine patches, a nicotine inhaler, or nicotine nasal spray can help with physical craving. Hypnosis, support groups, and medicines help break the habit of smoking. WHAT THINGS CAN I DO TO MAKE QUITTING EASIER?  Here are some tips to help you quit for good:  Pick a date when you will quit smoking completely. Tell all of your friends and family about your plan to quit on that date.  Do not try to slowly cut down on the number of cigarettes you are smoking. Pick a quit date and quit smoking completely starting on that day.  Throw away all cigarettes.   Clean and remove all ashtrays from your home, work, and car.  On a card, write down your reasons for quitting. Carry the card with you and read it when you get the urge to smoke.  Cleanse your body of nicotine. Drink enough water and fluids to keep your urine clear or pale yellow. Do this after quitting to flush the nicotine from your body.  Learn to predict your moods. Do not let a bad situation be your excuse to have a  cigarette. Some situations in your life might tempt you into wanting a cigarette.  Never have "just one" cigarette. It leads to wanting another and another. Remind yourself of your decision to quit.  Change habits associated with smoking. If you smoked while driving or when feeling stressed, try other activities to replace smoking. Stand up when drinking your coffee. Brush your teeth after eating. Sit in a different chair when you read the paper. Avoid alcohol while trying to quit, and try to drink fewer caffeinated beverages. Alcohol and caffeine may urge you to smoke.  Avoid foods and drinks that can trigger a desire to smoke, such as sugary or spicy foods and alcohol.  Ask people who smoke not to smoke around you.  Have something planned to do right after eating or having a cup of coffee. For example, plan to take a walk or exercise.  Try a relaxation exercise to calm you down and decrease your stress. Remember, you may be tense and nervous for the first 2 weeks after you quit, but this will pass.  Find new activities to keep your hands busy. Play with a pen, coin, or rubber band. Doodle or draw things on paper.  Brush your teeth right after eating. This will help cut down on the craving for the taste of tobacco after meals. You can also try mouthwash.   Use oral substitutes in place of cigarettes. Try using lemon drops, carrots, cinnamon sticks, or chewing gum. Keep them  handy so they are available when you have the urge to smoke.  When you have the urge to smoke, try deep breathing.  Designate your home as a nonsmoking area.  If you are a heavy smoker, ask your health care provider about a prescription for nicotine chewing gum. It can ease your withdrawal from nicotine.  Reward yourself. Set aside the cigarette money you save and buy yourself something nice.  Look for support from others. Join a support group or smoking cessation program. Ask someone at home or at work to help you  with your plan to quit smoking.  Always ask yourself, "Do I need this cigarette or is this just a reflex?" Tell yourself, "Today, I choose not to smoke," or "I do not want to smoke." You are reminding yourself of your decision to quit.  Do not replace cigarette smoking with electronic cigarettes (commonly called e-cigarettes). The safety of e-cigarettes is unknown, and some may contain harmful chemicals.  If you relapse, do not give up! Plan ahead and think about what you will do the next time you get the urge to smoke. HOW WILL I FEEL WHEN I QUIT SMOKING? You may have symptoms of withdrawal because your body is used to nicotine (the addictive substance in cigarettes). You may crave cigarettes, be irritable, feel very hungry, cough often, get headaches, or have difficulty concentrating. The withdrawal symptoms are only temporary. They are strongest when you first quit but will go away within 10-14 days. When withdrawal symptoms occur, stay in control. Think about your reasons for quitting. Remind yourself that these are signs that your body is healing and getting used to being without cigarettes. Remember that withdrawal symptoms are easier to treat than the major diseases that smoking can cause.  Even after the withdrawal is over, expect periodic urges to smoke. However, these cravings are generally short lived and will go away whether you smoke or not. Do not smoke! WHAT RESOURCES ARE AVAILABLE TO HELP ME QUIT SMOKING? Your health care provider can direct you to community resources or hospitals for support, which may include:  Group support.  Education.  Hypnosis.  Therapy.   This information is not intended to replace advice given to you by your health care provider. Make sure you discuss any questions you have with your health care provider.   Document Released: 03/07/2004 Document Revised: 06/30/2014 Document Reviewed: 11/25/2012 Elsevier Interactive Patient Education 2016 [employer].     Risk factors for prediabetes and type 2 diabetes Researchers don't fully understand why some people develop prediabetes and type 2 diabetes and others don't.  It's clear that certain factors increase the risk, however, including:  Weight. The more fatty tissue you have, the more resistant your cells become to insulin.  Inactivity. The less active you are, the greater your risk. Physical activity helps you control your weight, uses up glucose as energy and makes your cells more sensitive to insulin.  Family history. Your risk increases if a parent or sibling has type 2 diabetes.  Race. Although it's unclear why, people of certain races - including blacks, Hispanics, American Indians and Asian-Americans - are at higher risk.  Age. Your risk increases as you get older. This may be because you tend to exercise less, lose muscle mass and gain weight as you age. But type 2 diabetes is also increasing dramatically among children, adolescents and younger adults.  Gestational diabetes. If you developed gestational diabetes when you were pregnant, your risk of developing prediabetes  and type 2 diabetes later increases. If you gave birth to a baby weighing more than 9 pounds (4 kilograms), you're also at risk of type 2 diabetes.  Polycystic ovary syndrome. For women, having polycystic ovary syndrome - a common condition characterized by irregular menstrual periods, excess hair growth and obesity - increases the risk of diabetes.  High blood pressure. Having blood pressure over 140/90 millimeters of mercury (mm Hg) is linked to an increased risk of type 2 diabetes.  Abnormal cholesterol and triglyceride levels. If you have low levels of high-density lipoprotein (HDL), or "good," cholesterol, your risk of type 2 diabetes is higher. Triglycerides are another type of fat carried in the blood. People with high levels of triglycerides have an increased risk of type 2 diabetes. Your doctor can let you know  what your cholesterol and triglyceride levels are.    A good guide to good carbs: The glycemic index ---If you have diabetes, or at risk for diabetes, you know all too well that when you eat carbohydrates, your blood sugar goes up. The total amount of carbs you consume at a meal or in a snack mostly determines what your blood sugar will do. But the food itself also plays a role. A serving of white rice has almost the same effect as eating pure table sugar - a quick, high spike in blood sugar. A serving of lentils has a slower, smaller effect.  ---Picking good sources of carbs can help you control your blood sugar and your weight. Even if you don't have diabetes, eating healthier carbohydrate-rich foods can help ward off a host of chronic conditions, from heart disease to various cancers to, well, diabetes.  ---One way to choose foods is with the glycemic index (GI). This tool measures how much a food boosts blood sugar.  The glycemic index rates the effect of a specific amount of a food on blood sugar compared with the same amount of pure glucose. A food with a glycemic index of 28 boosts blood sugar only 28% as much as pure glucose. One with a GI of 95 acts like pure glucose.  High glycemic foods result in a quick spike in insulin and blood sugar (also known as blood glucose). Low glycemic foods have a slower, smaller effect.   Using the glycemic index Using the glycemic index is easy: choose foods in the low GI category instead of those in the high GI category (see below), and go easy on those in between. Low glycemic index (GI of 55 or less): Most fruits and vegetables, beans, minimally processed grains, pasta, low-fat dairy foods, and nuts.  Moderate glycemic index (GI 56 to 69): White and sweet potatoes, corn, white rice, couscous, breakfast cereals such as Cream of Wheat and Mini Wheats.  High glycemic index (GI of 70 or higher): White bread, rice cakes, most crackers, bagels, cakes,  doughnuts, croissants, most packaged breakfast cereals. You can see the values for 100 commons foods and get links to more at www.health.[employer].edu/glycemic.  Swaps for lowering glycemic index  Instead of this high-glycemic index food Eat this lower-glycemic index food  White rice Brown rice or converted rice  Instant oatmeal Steel-cut oats  Cornflakes Bran flakes  Baked potato Pasta, bulgur  White bread Whole-grain bread  Corn Peas or leafy greens     Guidelines for a Low Cholesterol, Low Saturated Fat Diet  Fats - Limit total intake of fats and oils. - Avoid butter, stick margarine, shortening, lard, palm and coconut oils. -  Limit mayonnaise, salad dressings, gravies and sauces, unless they are homemade with low-fat ingredients. - Limit chocolate. - Choose low-fat and nonfat products, such as low-fat mayonnaise, low-fat or non-hydrogenated peanut butter, low-fat or fat-free salad dressings and nonfat gravy. - Use vegetable oil, such as canola or olive oil. - Look for margarine that does not contain trans fatty acids. - Use nuts in moderate amounts. - Read ingredient labels carefully to determine both amount and type of fat present in foods. Limit saturated and trans fats! - Avoid high-fat processed and convenience foods.  Meats and Meat Alternatives - Choose fish, chicken, [place] and lean meats. - Use dried beans, peas, lentils and tofu. - Limit egg yolks to three to four per week. - If you eat red meat, limit to no more than three servings per week and choose loin or round cuts. - Avoid fatty meats, such as bacon, sausage, franks, luncheon meats and ribs. - Avoid all organ meats, including liver.  Dairy - Choose nonfat or low-fat milk, yogurt and cottage cheese. - Most cheeses are high in fat. Choose cheeses made from non-fat milk, such as mozzarella and ricotta cheese. - Choose light or fat-free cream cheese and sour cream. - Avoid cream and sauces made with  cream.  Fruits and Vegetables - Eat a wide variety of fruits and vegetables. - Use lemon juice, vinegar or "mist" olive oil on vegetables. - Avoid adding sauces, fat or oil to vegetables.  Breads, Cereals and Grains - Choose whole-grain breads, cereals, pastas and rice. - Avoid high-fat snack foods, such as granola, cookies, pies, pastries, doughnuts and croissants.  Cooking Tips - Avoid deep fried foods. - Trim visible fat off meats and remove skin from poultry before cooking. - Bake, broil, boil, poach or roast poultry, fish and lean meats. - Drain and discard fat that drains out of meat as you cook it. - Add little or no fat to foods. - Use vegetable oil sprays to grease pans for cooking or baking. - Steam vegetables. - Use herbs or no-oil marinades to flavor foods.

## 2016-02-09 DIAGNOSIS — E781 Pure hyperglyceridemia: Secondary | ICD-10-CM | POA: Insufficient documentation

## 2016-02-09 DIAGNOSIS — R5383 Other fatigue: Secondary | ICD-10-CM | POA: Insufficient documentation

## 2016-02-09 DIAGNOSIS — R0602 Shortness of breath: Secondary | ICD-10-CM | POA: Insufficient documentation

## 2016-02-09 DIAGNOSIS — E786 Lipoprotein deficiency: Secondary | ICD-10-CM

## 2016-02-09 DIAGNOSIS — E559 Vitamin D deficiency, unspecified: Secondary | ICD-10-CM | POA: Insufficient documentation

## 2016-02-09 DIAGNOSIS — R002 Palpitations: Secondary | ICD-10-CM | POA: Insufficient documentation

## 2016-02-09 NOTE — Assessment & Plan Note (Signed)
Patient not very reassured by normal EKG in the office today which I reviewed with her.  Advised to avoid stimulants such as caffeine, nicotine   After risks benefits and discussion of possible further workup, patient wants a 48 hour Holter monitor.  She understands it may be difficult to capture irregular heartbeats during limited time period but still would like the evaluation.  Discussed stress management and deep breathing techniques to do when she has these feelings.

## 2016-02-09 NOTE — Assessment & Plan Note (Signed)
Advised to go to: [employer].eeHttps://smokefree.gov/  Not sure she is ready to quit.  Education done and handouts provided

## 2016-02-09 NOTE — Assessment & Plan Note (Signed)
Patient not stressed anymore than usual, denies feeling physical stress symptoms.  I did explain that feeling heart racing/  shortness of breath etc can be symptoms of anxiety.   Advised she keep a log of her current emotions at the time she has the "palpitations", to see possible relation between the emotional and physical.

## 2016-02-09 NOTE — Assessment & Plan Note (Signed)
Supplementation recommended  Education provided

## 2016-02-09 NOTE — Assessment & Plan Note (Signed)
Stable, no signs iron deficiency anemia.

## 2016-02-09 NOTE — Addendum Note (Signed)
Addended by: Thomasene LotPALSKI, Nason Conradt on: 02/09/2016 03:48 PM   Modules accepted: Orders

## 2016-02-09 NOTE — Assessment & Plan Note (Signed)
Reassured that all recent lab work, including TSH, looks good with the exception of what is noted.  She understands is no laboratory reason for her fatigue  We discussed the physiologic effects of deconditioning and morbid obesity on the body.   Counseling done. Advised lifestyle modifications

## 2016-02-09 NOTE — Assessment & Plan Note (Addendum)
30 minutes of aerobic activity of moderate intensity daily and please follow a low saturated and Transfats diet as below. We will recheck her cholesterol in 1 year  Handouts provided

## 2016-02-12 ENCOUNTER — Encounter (INDEPENDENT_AMBULATORY_CARE_PROVIDER_SITE_OTHER): Payer: Self-pay

## 2016-02-12 ENCOUNTER — Ambulatory Visit (INDEPENDENT_AMBULATORY_CARE_PROVIDER_SITE_OTHER): Payer: PRIVATE HEALTH INSURANCE

## 2016-02-12 DIAGNOSIS — R0602 Shortness of breath: Secondary | ICD-10-CM | POA: Diagnosis not present

## 2016-02-12 DIAGNOSIS — R002 Palpitations: Secondary | ICD-10-CM | POA: Diagnosis not present

## 2016-02-12 DIAGNOSIS — R5383 Other fatigue: Secondary | ICD-10-CM

## 2016-07-23 ENCOUNTER — Encounter: Payer: Self-pay | Admitting: Adult Health

## 2016-07-23 ENCOUNTER — Ambulatory Visit (INDEPENDENT_AMBULATORY_CARE_PROVIDER_SITE_OTHER): Payer: PRIVATE HEALTH INSURANCE | Admitting: Adult Health

## 2016-07-23 VITALS — BP 108/75 | HR 78 | Temp 98.4°F | Ht 62.25 in | Wt 225.8 lb

## 2016-07-23 DIAGNOSIS — J111 Influenza due to unidentified influenza virus with other respiratory manifestations: Secondary | ICD-10-CM | POA: Insufficient documentation

## 2016-07-23 DIAGNOSIS — R0981 Nasal congestion: Secondary | ICD-10-CM | POA: Diagnosis not present

## 2016-07-23 DIAGNOSIS — M791 Myalgia, unspecified site: Secondary | ICD-10-CM

## 2016-07-23 DIAGNOSIS — R05 Cough: Secondary | ICD-10-CM | POA: Diagnosis not present

## 2016-07-23 DIAGNOSIS — R059 Cough, unspecified: Secondary | ICD-10-CM

## 2016-07-23 LAB — POCT INFLUENZA A/B
Influenza A, POC: NEGATIVE
Influenza B, POC: NEGATIVE

## 2016-07-23 MED ORDER — OSELTAMIVIR PHOSPHATE 75 MG PO CAPS: 75.0000 mg | ORAL_CAPSULE | Freq: Two times a day (BID) | ORAL | 0 refills | Status: DC

## 2016-07-23 MED ORDER — ONDANSETRON HCL 4 MG PO TABS: 4.0000 mg | ORAL_TABLET | Freq: Three times a day (TID) | ORAL | 0 refills | Status: DC | PRN

## 2016-07-23 NOTE — Progress Notes (Signed)
Subjective:    Patient ID: Kendra [name], female    DOB: 1980/07/03, 36 y.o.   MRN: [id number]  HPI : Ms. Kendra [name] presents with copious/clear nasal drainage, non-productive cough, night sweats/chills, body aches, and extreme fatigue that all began yesterday morning.  She also had nausea without vomiting yesterday.  She reports being around several individuals that have been positive for Influenza.     Patient Care Team    Relationship Specialty Notifications Start End  Thomasene Loteborah Opalski, DO PCP - General Family Medicine  01/08/16     Patient Active Problem List   Diagnosis Date Noted  . Sinus congestion 07/23/2016  . 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 Vitamin D deficiency 02/09/2016  . Palpitations 02/09/2016  . Fatigue 02/09/2016  . SOB (shortness of breath) 02/09/2016  . Genital herpes 01/08/2016  . HPV (human papilloma virus) anogenital infection 01/08/2016  . Irritable bowel syndrome (IBS) 01/08/2016  . GERD (gastroesophageal reflux disease) 01/08/2016  . Morbidly obese (HCC) 01/08/2016  . Constipation 01/08/2016  . Tobacco use disorder 01/08/2016  . History of abn Pap- 2007 01/08/2016  . History of iron deficiency anemia 01/08/2016     History reviewed. No pertinent past medical history.   Past Surgical History:  Procedure Laterality Date  . CERVIX SURGERY     removal of polyps and abnormal cells  . CESAREAN SECTION       Family History  Problem Relation Age of Onset  . Hyperlipidemia Father   . Hypertension Father   . Cancer Maternal Grandmother     Lung and Brain  . Cancer Maternal Grandfather     lung  . Hyperlipidemia Maternal Grandfather   . Diabetes Paternal Grandmother   . Diabetes Paternal Grandfather   . Hyperlipidemia Paternal Grandfather      History  Drug Use No     History  Alcohol use Not on file     History  Smoking Status  . Current Every Day Smoker  . Packs/day:  0.50  . Years: 17.00  Smokeless Tobacco  . Never Used     Outpatient Encounter Prescriptions as of 07/23/2016  Medication Sig  . Cholecalciferol (VITAMIN D3) 5000 units TABS 5,000 IU OTC vitamin D3 daily.  Marland Kitchen. ibuprofen (ADVIL,MOTRIN) 400 MG tablet Take 400 mg by mouth every 6 (six) hours as needed.  . IRON, FERROUS SULFATE, PO Take by mouth.  . ranitidine (ZANTAC) 150 MG tablet 150 mg twice daily - reflux  . vitamin C (ASCORBIC ACID) 500 MG tablet Take 500 mg by mouth daily.  . Vitamin D, Ergocalciferol, (DRISDOL) 50000 units CAPS capsule Take 1 capsule (50,000 Units total) by mouth every 7 (seven) days.   No facility-administered encounter medications on file as of 07/23/2016.     Allergies: Patient has no known allergies.  Body mass index is 40.97 kg/m.  Blood pressure 108/75, pulse 78, temperature 98.4 F (36.9 C), temperature source Oral, height 5' 2.25" (1.581 m), weight 225 lb 12.8 oz (102.4 kg), last menstrual period 06/21/2016.    Review of Systems  Constitutional: Positive for activity change, appetite change, chills, diaphoresis and fatigue. Negative for fever and unexpected weight change.  HENT: Positive for congestion, postnasal drip, rhinorrhea, sinus pain, sinus pressure, sneezing and sore throat. Negative for tinnitus, trouble swallowing and voice change.   Eyes: Negative for visual disturbance.  Respiratory: Positive for cough. Negative for shortness of breath.   Cardiovascular: Negative for chest pain, palpitations and leg  swelling.  Gastrointestinal: Positive for nausea. Negative for abdominal distention and vomiting.  Genitourinary: Negative for difficulty urinating.  Musculoskeletal: Positive for arthralgias.       Objective:   Physical Exam  Constitutional: She is oriented to person, place, and time. She appears well-developed and well-nourished. She appears distressed.  HENT:  Head: Normocephalic and atraumatic.  Right Ear: Hearing, tympanic membrane,  external ear and ear canal normal. No decreased hearing is noted.  Left Ear: Hearing, tympanic membrane, external ear and ear canal normal. No decreased hearing is noted.  Nose: Mucosal edema and rhinorrhea present. Right sinus exhibits no maxillary sinus tenderness and no frontal sinus tenderness. Left sinus exhibits no maxillary sinus tenderness and no frontal sinus tenderness.  Mouth/Throat: Uvula is midline.  Copious clear drainage down back of throat.  Eyes: Conjunctivae and EOM are normal. Pupils are equal, round, and reactive to light.  Neck: Normal range of motion. Neck supple.  Cardiovascular: Normal rate, regular rhythm, normal heart sounds and intact distal pulses.   Pulmonary/Chest: Effort normal and breath sounds normal.  Abdominal: Soft. Bowel sounds are normal. She exhibits no distension.  Lymphadenopathy:    She has no cervical adenopathy.  Neurological: She is alert and oriented to person, place, and time. She has normal reflexes.  Skin: Skin is warm and dry.  Psychiatric: She has a normal mood and affect. Her behavior is normal. Judgment and thought content normal.  Nursing note and vitals reviewed.         Assessment & Plan:   1. Myalgia   2. Cough   3. Sinus congestion   4. Influenza     Influenza Tamilfu 5 day course, despite negative Flu test. Has been exposed to several individuals with positive flu tests. Increase fluids/rest/vit c. OTC Acetaminophen or Ibuprofen per manufacturer's directions. HAND WASHING. Please call clinic if sx's persist after anti-viral completed.   Sinus congestion Increase fluids.    FOLLOW-UP:  Return if symptoms worsen or fail to improve.

## 2016-07-23 NOTE — Assessment & Plan Note (Addendum)
Tamilfu 5 day course, despite negative Flu test. Has been exposed to several individuals with positive flu tests. Increase fluids/rest/vit c. OTC Acetaminophen or Ibuprofen per manufacturer's directions. HAND WASHING. Please call clinic if sx's persist after anti-viral completed.

## 2016-07-23 NOTE — Patient Instructions (Signed)
Influenza, Adult Influenza ("the flu") is an infection in the lungs, nose, and throat (respiratory tract). It is caused by a virus. The flu causes many common cold symptoms, as well as a high fever and body aches. It can make you feel very sick. The flu spreads easily from person to person (is contagious). Getting a flu shot (influenza vaccination) every year is the best way to prevent the flu. Follow these instructions at home:  Take over-the-counter and prescription medicines only as told by your doctor.  Use a cool mist humidifier to add moisture (humidity) to the air in your home. This can make it easier to breathe.  Rest as needed.  Drink enough fluid to keep your pee (urine) clear or pale yellow.  Cover your mouth and nose when you cough or sneeze.  Wash your hands with soap and water often, especially after you cough or sneeze. If you cannot use soap and water, use hand sanitizer.  Stay home from work or school as told by your doctor. Unless you are visiting your doctor, try to avoid leaving home until your fever has been gone for 24 hours without the use of medicine.  Keep all follow-up visits as told by your doctor. This is important. How is this prevented?  Getting a yearly (annual) flu shot is the best way to avoid getting the flu. You may get the flu shot in late summer, fall, or winter. Ask your doctor when you should get your flu shot.  Wash your hands often or use hand sanitizer often.  Avoid contact with people who are sick during cold and flu season.  Eat healthy foods.  Drink plenty of fluids.  Get enough sleep.  Exercise regularly. Contact a doctor if:  You get new symptoms.  You have:  Chest pain.  Watery poop (diarrhea).  A fever.  Your cough gets worse.  You start to have more mucus.  You feel sick to your stomach (nauseous).  You throw up (vomit). Get help right away if:  You start to be short of breath or have trouble breathing.  Your  skin or nails turn a bluish color.  You have very bad pain or stiffness in your neck.  You get a sudden headache.  You get sudden pain in your face or ear.  You cannot stop throwing up. This information is not intended to replace advice given to you by your health care provider. Make sure you discuss any questions you have with your health care provider. Document Released: 03/18/2008 Document Revised: 11/15/2015 Document Reviewed: 04/03/2015 Elsevier Interactive Patient Education  2017 [employer]  Take Tamilfu and Ondansetron as directed. Increase fluids/rest/vit c. Either OTC Acetaminophen or Ibuprofen per manufacturer's instructions. HAND WASHING! Please call clinic if symptoms persist/worsen after anti-viral completed.

## 2016-07-23 NOTE — Assessment & Plan Note (Signed)
Increase fluids.

## 2016-07-25 ENCOUNTER — Telehealth: Payer: Self-pay | Admitting: Family Medicine

## 2016-07-25 NOTE — Telephone Encounter (Signed)
Patient says she is having some kind of red bumps appearing on her she is concerned she is having an reaction to the Tamiflu-- glh

## 2016-08-07 ENCOUNTER — Ambulatory Visit: Payer: PRIVATE HEALTH INSURANCE | Admitting: Family Medicine

## 2016-09-09 ENCOUNTER — Ambulatory Visit: Payer: PRIVATE HEALTH INSURANCE | Admitting: Family Medicine

## 2016-10-27 ENCOUNTER — Encounter: Payer: Self-pay | Admitting: Family Medicine

## 2016-10-27 ENCOUNTER — Ambulatory Visit (INDEPENDENT_AMBULATORY_CARE_PROVIDER_SITE_OTHER): Payer: PRIVATE HEALTH INSURANCE | Admitting: Family Medicine

## 2016-10-27 DIAGNOSIS — E786 Lipoprotein deficiency: Secondary | ICD-10-CM | POA: Diagnosis not present

## 2016-10-27 DIAGNOSIS — F411 Generalized anxiety disorder: Secondary | ICD-10-CM | POA: Diagnosis not present

## 2016-10-27 DIAGNOSIS — E559 Vitamin D deficiency, unspecified: Secondary | ICD-10-CM | POA: Diagnosis not present

## 2016-10-27 DIAGNOSIS — Z716 Tobacco abuse counseling: Secondary | ICD-10-CM | POA: Insufficient documentation

## 2016-10-27 DIAGNOSIS — F17200 Nicotine dependence, unspecified, uncomplicated: Secondary | ICD-10-CM | POA: Diagnosis not present

## 2016-10-27 DIAGNOSIS — M722 Plantar fascial fibromatosis: Secondary | ICD-10-CM | POA: Diagnosis not present

## 2016-10-27 DIAGNOSIS — K582 Mixed irritable bowel syndrome: Secondary | ICD-10-CM | POA: Diagnosis not present

## 2016-10-27 DIAGNOSIS — R5382 Chronic fatigue, unspecified: Secondary | ICD-10-CM | POA: Diagnosis not present

## 2016-10-27 DIAGNOSIS — Z23 Encounter for immunization: Secondary | ICD-10-CM | POA: Diagnosis not present

## 2016-10-27 DIAGNOSIS — E781 Pure hyperglyceridemia: Secondary | ICD-10-CM | POA: Diagnosis not present

## 2016-10-27 NOTE — Progress Notes (Signed)
Impression and Recommendations:    1. Morbidly obese (HCC)   2. GAD (generalized anxiety disorder)   3. Vitamin D deficiency   4. Tobacco use disorder   5. Tobacco abuse counseling   6. Irritable bowel syndrome with both constipation and diarrhea   7. Low HDL (under 40)   8. Hypertriglyceridemia   9. Need for Tdap vaccination   10. Plantar fasciitis of left foot   11. 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 Chronic fatigue    - labs in future to be ordered:   f/up mid- end Aug for Vit D, B12, FLP, bmp; then OV with me following bldwrk and Discuss Tob abuse txmnt options    - tob abuse counseling done, not ready to quit  - FODMAP diet d/c pt; food journal  - diet and exercise for txmnt TG and low HDL  - will check Chol panel near future--> diet and exercise d/c pt  - referral for podiatry for orthotics;    Supportive shoes at all times; ice, stretch and handouts given to pt in addition to counseling done  - cont vit D- will monitor   The patient was counseled, risk factors were discussed, anticipatory guidance given.   New Prescriptions   No medications on file     Orders Placed This Encounter  Procedures  . Tdap vaccine greater than or equal to 7yo IM  . VITAMIN D 25 Hydroxy (Vit-D Deficiency, Fractures)  . Vitamin B12  . Lipid panel  . Basic metabolic panel  . Ambulatory referral to Podiatry     Gross side effects, risk and benefits, and alternatives of medications and treatment plan in general discussed with patient.  Patient is aware that all medications have potential side effects and we are unable to predict every side effect or drug-drug interaction that may occur.   Patient will call with any questions prior to using medication if they have concerns.  Expresses verbal understanding and consents to current therapy and treatment regimen.  No barriers to understanding were identified.  Red flag symptoms and signs discussed in detail.   Patient expressed understanding regarding what to do in case of emergency\urgent symptoms  Please see AVS handed out to patient at the end of our visit for further patient instructions/ counseling done pertaining to today's office visit.   Return for f/up mid- end Aug for Vit D, B12, FLP, bmp; then OV with me following bldwrk. Discuss Tob abuse   .     Note: This document was prepared using Dragon voice recognition software and may include unintentional dictation errors.   --------------------------------------------------------------------------------------------------------------------------------------------------------------------------------------------------------------------------------------------    Subjective:    CC:  Chief Complaint  Patient presents with  . Palpitations  . Fatigue  . Vitamin D deficiency    HPI: Kendra [name] is a 36 y.o. female who presents to [facility] today for issues as discussed below.    Pt came late to OV, so only had very limited time with pt today.    Taking Vit D - taking wkly and daily fatigue is a lot better.   Hasn't had reck yet.  IBS:  Had 2 root canals since last OV which threw her stomach off tremendously.  Taking ranitidine most days.   Tried FODMAP diet initially- but fell off.  spicey, tacos and fatty foods make her GI sx W.    occ gets heart palp - awakes in the  am sometimes and feels it when lying in bed- and she just feels it.    NO assoc SX at all.   Not related to activity.   No complaints doesn't;t bother her enough to go for [facility] monitoring or toher eval.   TOB abuse  - still smoking - about 1/2-1 ppd.  NOt ready to quit  CC:  Pain btm L foot ever since trip to [facility] and did much more walking then normal.  Hurts worst first thing in am and after rest and goes to [place] on it.  Feels tight and sharp pains with walking on it.   Wears VERY poorly supportive shoes and goes barefoot a lot of  the time.     Wt Readings from Last 3 Encounters:  10/27/16 226 lb 14.4 oz (102.9 kg)  07/23/16 225 lb 12.8 oz (102.4 kg)  02/04/16 221 lb 6.4 oz (100.4 kg)   BP Readings from Last 3 Encounters:  10/27/16 121/82  07/23/16 108/75  02/04/16 128/89   Pulse Readings from Last 3 Encounters:  10/27/16 80  07/23/16 78  02/04/16 80   BMI Readings from Last 3 Encounters:  10/27/16 41.17 kg/m  07/23/16 40.97 kg/m  02/04/16 40.17 kg/m     Patient Care Team    Relationship Specialty Notifications Start End  Thomasene Lot, DO PCP - General Family Medicine  01/08/16      Patient Active Problem List   Diagnosis Date Noted  . Irritable bowel syndrome (IBS) 01/08/2016    Priority: High  . GERD (gastroesophageal reflux disease) 01/08/2016    Priority: High  . Morbidly obese (HCC) 01/08/2016    Priority: High  . GAD (generalized anxiety disorder) 10/27/2016    Priority: Medium  . Tobacco abuse counseling 10/27/2016    Priority: Medium  . Low HDL (under 40) 10/27/2016    Priority: Medium  . Hypertriglyceridemia 10/27/2016    Priority: Medium  . 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 Genital herpes 01/08/2016    Priority: Medium  . HPV (human papilloma virus) anogenital infection 01/08/2016    Priority: Medium  . Tobacco use disorder 01/08/2016    Priority: Medium  . History of abn Pap- 2007 01/08/2016    Priority: Medium  . Vitamin D deficiency 02/09/2016    Priority: Low  . History of iron deficiency anemia 01/08/2016    Priority: Low  . Sinus congestion 07/23/2016  . Influenza 07/23/2016  . 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 Palpitations 02/09/2016  . Fatigue 02/09/2016  . SOB (shortness of breath) 02/09/2016  . Constipation 01/08/2016    Past Medical history, Surgical history, Family history, Social history, Allergies and Medications have been entered  into the medical record, reviewed and changed as needed.    Current Meds  Medication Sig  . Cholecalciferol (VITAMIN D3) 5000 units TABS 5,000 IU OTC vitamin D3 daily.  Marland Kitchen ibuprofen (ADVIL,MOTRIN) 400 MG tablet Take 400 mg by mouth every 6 (six) hours as needed.  . IRON, FERROUS SULFATE, PO Take by mouth.  . ranitidine (ZANTAC) 150 MG tablet 150 mg twice daily - reflux  . vitamin C (ASCORBIC ACID) 500 MG tablet Take 500 mg by mouth daily.  . Vitamin D, Ergocalciferol, (DRISDOL) 50000 units CAPS capsule Take 1 capsule (50,000 Units total) by mouth every 7 (seven) days.  . [DISCONTINUED] ondansetron (ZOFRAN) 4 MG tablet Take 1 tablet (4 mg total) by  mouth every 8 (eight) hours as needed for nausea or vomiting.  . [DISCONTINUED] oseltamivir (TAMIFLU) 75 MG capsule Take 1 capsule (75 mg total) by mouth 2 (two) times daily.    Allergies:  No Known Allergies   Review of Systems: General:   Denies fever, chills, unexplained weight loss.  Optho/Auditory:   Denies visual changes, blurred vision/LOV Respiratory:   Denies wheeze, DOE more than baseline levels.  Cardiovascular:   Denies chest pain, palpitations, new onset peripheral edema  Gastrointestinal:   Denies nausea, vomiting, diarrhea, abd pain.  Genitourinary: Denies dysuria, freq/ urgency, flank pain or discharge from genitals.  Endocrine:     Denies hot or cold intolerance, polyuria, polydipsia. Musculoskeletal:   Denies unexplained myalgias, joint swelling, unexplained arthralgias, gait problems.  Skin:  Denies new onset rash, suspicious lesions Neurological:     Denies dizziness, unexplained weakness, numbness  Psychiatric/Behavioral:   Denies mood changes, suicidal or homicidal ideations, hallucinations       Objective:   Blood pressure 121/82, pulse 80, height 5' 2.25" (1.581 m), weight 226 lb 14.4 oz (102.9 kg), last menstrual period 10/26/2016. Body mass index is 41.17 kg/m. General:  Well Developed, well nourished,  appropriate for stated age.  Neuro:  Alert and oriented,  extra-ocular muscles intact  HEENT:  Normocephalic, atraumatic, neck supple, no carotid bruits appreciated  Skin:  no gross rash, warm, pink. Cardiac:  RRR, S1 S2 Respiratory:  ECTA B/L and A/P, Not using accessory muscles, speaking in full sentences- unlabored. Vascular:  Ext warm, no cyanosis apprec.; cap RF less 2 sec. Psych:  No HI/SI, judgement and insight good, Euthymic mood. Full Affect. L Foot: No visible erythema or swelling. Range of motion is full in all directions. Strength is 5/5  High arches + callus noted. No pain over the navicular prominence, or base of fifth metatarsal. ++  tenderness to palpation of the calcaneal insertion of plantar fascia. No pain at the Achilles insertion. No tenderness to palpation over the tarsals, metatarsals, or phalanges. Neurovascularly intact distally.

## 2016-10-27 NOTE — Patient Instructions (Addendum)
Please give pt plantar fascitis handout as well from sports med advisor.  Asked Marchelle Folks.      Tobacco Use Disorder Tobacco use disorder (TUD) is a mental disorder. It is the long-term use of tobacco in spite of related health problems or difficulty with normal life activities. Tobacco is most commonly smoked as cigarettes and less commonly as cigars or pipes. Smokeless chewing tobacco and snuff are also popular. People with TUD get a feeling of extreme pleasure (euphoria) from using tobacco and have a desire to use it again and again. Repeated use of tobacco can cause problems. The addictive effects of tobacco are due mainly tothe ingredient nicotine. Nicotine also causes a rush of adrenaline (epinephrine) in the body. This leads to increased blood pressure, heart rate, and breathing rate. These changes may cause problems for people with high blood pressure, weak hearts, or lung disease. High doses of nicotine in children and pets can lead to seizures and death. Tobacco contains a number of other unsafe chemicals. These chemicals are especially harmful when inhaled as smoke and can damage almost every organ in the body. Smokers live shorter lives than nonsmokers and are at risk of dying from a number of diseases and cancers. Tobacco smoke can also cause health problems for nonsmokers (due to inhaling secondhand smoke). Smoking is also a fire hazard. TUD usually starts in the late teenage years and is most common in young adults between the ages of 44 and 25 years. People who start smoking earlier in life are more likely to continue smoking as adults. TUD is somewhat more common in men than women. People with TUD are at higher risk for using alcohol and other drugs of abuse. What increases the risk? Risk factors for TUD include:  Having family members with the disorder.  Being around people who use tobacco.  Having an existing mental health issue such as schizophrenia, depression, bipolar disorder,  ADHD, or posttraumatic stress disorder (PTSD). What are the signs or symptoms? People with tobacco use disorder have two or more of the following signs and symptoms within 12 months:  Use of more tobacco over a longer period than intended.  Not able to cut down or control tobacco use.  A lot of time spent obtaining or using tobacco.  Strong desire or urge to use tobacco (craving). Cravings may last for 6 months or longer after quitting.  Use of tobacco even when use leads to major problems at work, school, or home.  Use of tobacco even when use leads to relationship problems.  Giving up or cutting down on important life activities because of tobacco use.  Repeatedly using tobacco in situations where it puts you or others in physical danger, like smoking in bed.  Use of tobacco even when it is known that a physical or mental problem is likely related to tobacco use.  Physical problems are numerous and may include chronic bronchitis, emphysema, lung and other cancers, gum disease, high blood pressure, heart disease, and stroke.  Mental problems caused by tobacco may include difficulty sleeping and anxiety.  Need to use greater amounts of tobacco to get the same effect. This means you have developed a tolerance.  Withdrawal symptoms as a result of stopping or rapidly cutting back use. These symptoms may last a month or more after quitting and include the following:  Depressed, anxious, or irritable mood.  Difficulty concentrating.  Increased appetite.  Restlessness or trouble sleeping.  Use of tobacco to avoid withdrawal symptoms. How is  this diagnosed? Tobacco use disorder is diagnosed by your health care provider. A diagnosis may be made by:  Your health care provider asking questions about your tobacco use and any problems it may be causing.  A physical exam.  Lab tests.  You may be referred to a mental health professional or addiction specialist. The severity of  tobacco use disorder depends on the number of signs and symptoms you have:  Mild-Two or three symptoms.  Moderate-Four or five symptoms.  Severe-Six or more symptoms. How is this treated? Many people with tobacco use disorder are unable to quit on their own and need help. Treatment options include the following:  Nicotine replacement therapy (NRT). NRT provides nicotine without the other harmful chemicals in tobacco. NRT gradually lowers the dosage of nicotine in the body and reduces withdrawal symptoms. NRT is available in over-the-counter forms (gum, lozenges, and skin patches) as well as prescription forms (mouth inhaler and nasal spray).  Medicines.This may include:  Antidepressant medicine that may reduce nicotine cravings.  A medicine that acts on nicotine receptors in the brain to reduce cravings and withdrawal symptoms. It may also block the effects of tobacco in people with TUD who relapse.  Counseling or talk therapy. A form of talk therapy called behavioral therapy is commonly used to treat people with TUD. Behavioral therapy looks at triggers for tobacco use, how to avoid them, and how to cope with cravings. It is most effective in person or by phone but is also available in self-help forms (books and Internet websites).  Support groups. These provide emotional support, advice, and guidance for quitting tobacco. The most effective treatment for TUD is usually a combination of medicine, talk therapy, and support groups. Follow these instructions at home:  Keep all follow-up visits as directed by your health care provider. This is important.  Take medicines only as directed by your health care provider.  Check with your health care provider before starting new prescription or over-the-counter medicines. Contact a health care provider if:  You are not able to take your medicines as prescribed.  Treatment is not helping your TUD and your symptoms get worse. Get help right  away if:  You have serious thoughts about hurting yourself or others.  You have trouble breathing, chest pain, sudden weakness, or sudden numbness in part of your body. This information is not intended to replace advice given to you by your health care provider. Make sure you discuss any questions you have with your health care provider. Document Released: 02/13/2004 Document Revised: 02/10/2016 Document Reviewed: 08/05/2013 Elsevier Interactive Patient Education  2017 Elsevier Inc.     Diet for Irritable Bowel Syndrome When you have irritable bowel syndrome (IBS), the foods you eat and your eating habits are very important. IBS may cause various symptoms, such as abdominal pain, constipation, or diarrhea. Choosing the right foods can help ease discomfort caused by these symptoms. Work with your health care provider and dietitian to find the best eating plan to help control your symptoms. What general guidelines do I need to follow?  Keep a food diary. This will help you identify foods that cause symptoms. Write down:  What you eat and when.  What symptoms you have.  When symptoms occur in relation to your meals.  Avoid foods that cause symptoms. Talk with your dietitian about other ways to get the same nutrients that are in these foods.  Eat more foods that contain fiber. Take a fiber supplement if directed by your  dietitian.  Eat your meals slowly, in a relaxed setting.  Aim to eat 5-6 small meals per day. Do not skip meals.  Drink enough fluids to keep your urine clear or pale yellow.  Ask your health care provider if you should take an over-the-counter probiotic during flare-ups to help restore healthy gut bacteria.  If you have cramping or diarrhea, try making your meals low in fat and high in carbohydrates. Examples of carbohydrates are pasta, rice, whole grain breads and cereals, fruits, and vegetables.  If dairy products cause your symptoms to flare up, try eating less  of them. You might be able to handle yogurt better than other dairy products because it contains bacteria that help with digestion. What foods are not recommended? The following are some foods and drinks that may worsen your symptoms:  Fatty foods, such as [place] fries.  Milk products, such as cheese or ice cream.  Chocolate.  Alcohol.  Products with caffeine, such as coffee.  Carbonated drinks, such as soda. The items listed above may not be a complete list of foods and beverages to avoid. Contact your dietitian for more information.  What foods are good sources of fiber? Your health care provider or dietitian may recommend that you eat more foods that contain fiber. Fiber can help reduce constipation and other IBS symptoms. Add foods with fiber to your diet a little at a time so that your body can get used to them. Too much fiber at once might cause gas and swelling of your abdomen. The following are some foods that are good sources of fiber:  Apples.  Peaches.  Pears.  Berries.  Figs.  Broccoli (raw).  Cabbage.  Carrots.  Raw peas.  Kidney beans.  Lima beans.  Whole grain bread.  Whole grain cereal. Where to find more information: [employer] for Functional Gastrointestinal Disorders: www.iffgd.[employer] of Diabetes and Digestive and Kidney Diseases: http://norris-lawson.com/.aspx This information is not intended to replace advice given to you by your health care provider. Make sure you discuss any questions you have with your health care provider. Document Released: 08/30/2003 Document Revised: 11/15/2015 Document Reviewed: 09/09/2013 Elsevier Interactive Patient Education  2017 [employer].

## 2016-11-26 ENCOUNTER — Ambulatory Visit: Payer: PRIVATE HEALTH INSURANCE | Admitting: Podiatry

## 2017-02-02 ENCOUNTER — Ambulatory Visit: Payer: PRIVATE HEALTH INSURANCE | Admitting: Family Medicine

## 2019-06-16 DIAGNOSIS — Z20828 Contact with and (suspected) exposure to other viral communicable diseases: Secondary | ICD-10-CM | POA: Diagnosis not present

## 2019-06-16 DIAGNOSIS — J3489 Other specified disorders of nose and nasal sinuses: Secondary | ICD-10-CM | POA: Diagnosis not present

## 2019-07-01 DIAGNOSIS — Z20828 Contact with and (suspected) exposure to other viral communicable diseases: Secondary | ICD-10-CM | POA: Diagnosis not present

## 2019-07-01 DIAGNOSIS — R05 Cough: Secondary | ICD-10-CM | POA: Diagnosis not present

## 2019-07-01 DIAGNOSIS — U071 COVID-19: Secondary | ICD-10-CM | POA: Diagnosis not present

## 2019-07-01 DIAGNOSIS — R438 Other disturbances of smell and taste: Secondary | ICD-10-CM | POA: Diagnosis not present

## 2019-08-02 DIAGNOSIS — Z01419 Encounter for gynecological examination (general) (routine) without abnormal findings: Secondary | ICD-10-CM | POA: Diagnosis not present

## 2019-08-02 DIAGNOSIS — Z6836 Body mass index (BMI) 36.0-36.9, adult: Secondary | ICD-10-CM | POA: Diagnosis not present

## 2019-09-22 DIAGNOSIS — Z0000 Encounter for general adult medical examination without abnormal findings: Secondary | ICD-10-CM | POA: Diagnosis not present

## 2019-09-30 DIAGNOSIS — Z131 Encounter for screening for diabetes mellitus: Secondary | ICD-10-CM | POA: Diagnosis not present

## 2019-09-30 DIAGNOSIS — Z13220 Encounter for screening for lipoid disorders: Secondary | ICD-10-CM | POA: Diagnosis not present

## 2019-09-30 DIAGNOSIS — Z130 Encounter for screening for diseases of the blood and blood-forming organs and certain disorders involving the immune mechanism: Secondary | ICD-10-CM | POA: Diagnosis not present

## 2019-09-30 DIAGNOSIS — Z1329 Encounter for screening for other suspected endocrine disorder: Secondary | ICD-10-CM | POA: Diagnosis not present

## 2019-10-20 DIAGNOSIS — D239 Other benign neoplasm of skin, unspecified: Secondary | ICD-10-CM | POA: Diagnosis not present

## 2019-10-20 DIAGNOSIS — L821 Other seborrheic keratosis: Secondary | ICD-10-CM | POA: Diagnosis not present

## 2020-02-17 ENCOUNTER — Ambulatory Visit
Admission: EM | Admit: 2020-02-17 | Discharge: 2020-02-17 | Disposition: A | Discharge status: Home / Self Care | Payer: PRIVATE HEALTH INSURANCE | Attending: Emergency Medicine | Admitting: Emergency Medicine

## 2020-02-17 DIAGNOSIS — B349 Viral infection, unspecified: Secondary | ICD-10-CM

## 2020-02-17 DIAGNOSIS — J029 Acute pharyngitis, unspecified: Secondary | ICD-10-CM | POA: Diagnosis not present

## 2020-02-17 DIAGNOSIS — Z20822 Contact with and (suspected) exposure to covid-19: Secondary | ICD-10-CM

## 2020-02-17 NOTE — Discharge Instructions (Signed)
Your COVID 19 results will be available in 24-48 hours. Negative results are immediately resulted to Mychart. Positive results will receive a follow-up call from our clinic. If symptoms are present, I recommend home quarantine until results are known.  Recommendation management of viral illness include: Vitamin D 5,000 IU daily Vitamin C 500 mg twice daily Zinc 50 mg daily  Take antihistamines as needed for nasal congestion.

## 2020-02-17 NOTE — ED Provider Notes (Signed)
[facility]    CSN: [id number] Arrival date & time: 02/17/20  8315      History   Chief Complaint Chief Complaint  Patient presents with  . Sore Throat    HPI Kendra [name] is a 39 y.o. female.   HPI Encounter for COVID-19 Testing in Symptomatic Patient Recent Exposure to persons positive for COVID-19: No  Experienced Fever: No  Current Symptoms: fatigue, sore throat, facial pressure, congestion She has not taken medication for symptoms. Symptoms mildly present for 2 days   History reviewed. No pertinent past medical history.  Patient Active Problem List   Diagnosis Date Noted  . GAD (generalized anxiety disorder) 10/27/2016  . Tobacco abuse counseling 10/27/2016  . Low HDL (under 40) 10/27/2016  . Hypertriglyceridemia 10/27/2016  . 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 Sinus congestion 07/23/2016  . Influenza 07/23/2016  . 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 Vitamin D deficiency 02/09/2016  . Palpitations 02/09/2016  . Fatigue 02/09/2016  . SOB (shortness of breath) 02/09/2016  . Genital herpes 01/08/2016  . HPV (human papilloma virus) anogenital infection 01/08/2016  . Irritable bowel syndrome (IBS) 01/08/2016  . GERD (gastroesophageal reflux disease) 01/08/2016  . Morbidly obese (HCC) 01/08/2016  . Constipation 01/08/2016  . Tobacco use disorder 01/08/2016  . History of abn Pap- 2007 01/08/2016  . History of iron deficiency anemia 01/08/2016    Past Surgical History:  Procedure Laterality Date  . CERVIX SURGERY     removal of polyps and abnormal cells  . CESAREAN SECTION      OB History   No obstetric history on file.      Home Medications    Prior to Admission medications   Medication Sig Start Date End Date Taking? Authorizing Provider  Cholecalciferol (VITAMIN D3) 5000 units TABS 5,000 IU OTC vitamin D3 daily. 02/04/16   Opalski, Gavin Pound, DO   ibuprofen (ADVIL,MOTRIN) 400 MG tablet Take 400 mg by mouth every 6 (six) hours as needed.    [provider]  IRON, FERROUS SULFATE, PO Take by mouth.    [provider]  ranitidine (ZANTAC) 150 MG tablet 150 mg twice daily - reflux 01/08/16   Opalski, Gavin Pound, DO  vitamin C (ASCORBIC ACID) 500 MG tablet Take 500 mg by mouth daily.    [provider]  Vitamin D, Ergocalciferol, (DRISDOL) 50000 units CAPS capsule Take 1 capsule (50,000 Units total) by mouth every 7 (seven) days. 02/04/16   Thomasene Lot, DO    Family History Family History  Problem Relation Age of Onset  . Hyperlipidemia Father   . Hypertension Father   . Cancer Maternal Grandmother        Lung and Brain  . Cancer Maternal Grandfather        lung  . Hyperlipidemia Maternal Grandfather   . Diabetes Paternal Grandmother   . Diabetes Paternal Grandfather   . Hyperlipidemia Paternal Grandfather     Social History Social History   Tobacco Use  . Smoking status: Current Every Day Smoker    Packs/day: 0.50    Years: 17.00    Pack years: 8.50  . Smokeless tobacco: Never Used  Substance Use Topics  . Alcohol use: Not on file  . Drug use: No     Allergies   Patient has no known allergies.   Review of Systems Review of Systems Pertinent negatives listed in HPI  Physical Exam Triage Vital Signs ED Triage  Vitals [02/17/20 1022]  Enc Vitals Group     BP 108/71     Pulse Rate 73     Resp 16     Temp 98 F (36.7 C)     Temp Source Oral     SpO2 98 %     Weight      Height      Head Circumference      Peak Flow      Pain Score 0     Pain Loc      Pain Edu?      Excl. in GC?    No data found.  Updated Vital Signs BP 108/71 (BP Location: Right Arm)   Pulse 73   Temp 98 F (36.7 C) (Oral)   Resp 16   LMP 02/17/2020   SpO2 98%   Visual Acuity Right Eye Distance:   Left Eye Distance:   Bilateral Distance:    Right Eye Near:   Left Eye Near:    Bilateral  Near:     Physical Exam General appearance: alert, well developed, well nourished, cooperative and in no distress Head: Normocephalic, without obvious abnormality, atraumatic ENT: External ears normal, nares congestion present, oropharynx mildly edematous Respiratory: Respirations even and unlabored, normal respiratory rate Heart: rate and rhythm normal. No gallop or murmurs noted on exam  Abdomen: BS +, no distention, no rebound tenderness, or no mass Extremities: No gross deformities Skin: Skin color, texture, turgor normal. No rashes seen  Psych: Appropriate mood and affect. Neurologic: Mental status: Alert, oriented to person, place, and time, thought content appropriate.  UC Treatments / Results  Labs (all labs ordered are listed, but only abnormal results are displayed) Labs Reviewed  SARS CORONAVIRUS 2 BY RT PCR (HOSPITAL ORDER, PERFORMED IN [facility])    EKG   Radiology No results found.  Procedures Procedures (including critical care time)  Medications Ordered in UC Medications - No data to display  Initial Impression / Assessment and Plan / UC Course  I have reviewed the triage vital signs and the nursing notes.  Pertinent labs & imaging results that were available during my care of the patient were reviewed by me and considered in my medical decision making (see chart for details).     COVID-19 test pending. Work note declined. Patient encouraged to self isolate while test is pending. Red flags discussed. Final Clinical Impressions(s) / UC Diagnoses   Final diagnoses:  Sore throat  Suspected COVID-19 virus infection     Discharge Instructions     Your COVID 19 results will be available in 24-48 hours. Negative results are immediately resulted to Mychart. Positive results will receive a follow-up call from our clinic. If symptoms are present, I recommend home quarantine until results are known.  Recommendation management of viral illness  include: Vitamin D 5,000 IU daily Vitamin C 500 mg twice daily Zinc 50 mg daily  Take antihistamines as needed for nasal congestion.    ED Prescriptions    None     PDMP not reviewed this encounter.   Bing Neighbors, FNP 02/17/20 1044

## 2020-02-17 NOTE — ED Triage Notes (Addendum)
Pt present sore throat and nasal cavity burning sensation. Symptoms started this am, but on Tuesday she felt worst with the same symptoms so she would like to get checked for covid 19 .

## 2020-02-19 LAB — SARS-COV-2, NAA 2 DAY TAT

## 2020-02-19 LAB — NOVEL CORONAVIRUS, NAA: SARS-CoV-2, NAA: NOT DETECTED

## 2020-02-21 DIAGNOSIS — L814 Other melanin hyperpigmentation: Secondary | ICD-10-CM | POA: Diagnosis not present

## 2020-02-21 DIAGNOSIS — L578 Other skin changes due to chronic exposure to nonionizing radiation: Secondary | ICD-10-CM | POA: Diagnosis not present

## 2020-02-21 DIAGNOSIS — D1801 Hemangioma of skin and subcutaneous tissue: Secondary | ICD-10-CM | POA: Diagnosis not present

## 2020-03-23 DIAGNOSIS — Z03818 Encounter for observation for suspected exposure to other biological agents ruled out: Secondary | ICD-10-CM | POA: Diagnosis not present

## 2020-07-02 DIAGNOSIS — Z1152 Encounter for screening for COVID-19: Secondary | ICD-10-CM | POA: Diagnosis not present

## 2020-09-14 DIAGNOSIS — Z6841 Body Mass Index (BMI) 40.0 and over, adult: Secondary | ICD-10-CM | POA: Diagnosis not present

## 2020-09-14 DIAGNOSIS — Z01419 Encounter for gynecological examination (general) (routine) without abnormal findings: Secondary | ICD-10-CM | POA: Diagnosis not present

## 2020-09-24 DIAGNOSIS — R42 Dizziness and giddiness: Secondary | ICD-10-CM | POA: Diagnosis not present

## 2020-09-24 DIAGNOSIS — E669 Obesity, unspecified: Secondary | ICD-10-CM | POA: Diagnosis not present

## 2020-09-24 DIAGNOSIS — Z13220 Encounter for screening for lipoid disorders: Secondary | ICD-10-CM | POA: Diagnosis not present

## 2020-09-24 DIAGNOSIS — Z0000 Encounter for general adult medical examination without abnormal findings: Secondary | ICD-10-CM | POA: Diagnosis not present

## 2020-10-03 DIAGNOSIS — Z1231 Encounter for screening mammogram for malignant neoplasm of breast: Secondary | ICD-10-CM | POA: Diagnosis not present

## 2021-04-26 DIAGNOSIS — R5383 Other fatigue: Secondary | ICD-10-CM | POA: Diagnosis not present

## 2021-04-26 DIAGNOSIS — G479 Sleep disorder, unspecified: Secondary | ICD-10-CM | POA: Diagnosis not present

## 2021-05-21 DIAGNOSIS — J040 Acute laryngitis: Secondary | ICD-10-CM | POA: Diagnosis not present

## 2021-06-05 DIAGNOSIS — G2581 Restless legs syndrome: Secondary | ICD-10-CM | POA: Diagnosis not present

## 2021-06-05 DIAGNOSIS — G4700 Insomnia, unspecified: Secondary | ICD-10-CM | POA: Diagnosis not present

## 2021-09-25 DIAGNOSIS — G2581 Restless legs syndrome: Secondary | ICD-10-CM | POA: Diagnosis not present

## 2021-09-25 DIAGNOSIS — Z1159 Encounter for screening for other viral diseases: Secondary | ICD-10-CM | POA: Diagnosis not present

## 2021-09-25 DIAGNOSIS — Z0000 Encounter for general adult medical examination without abnormal findings: Secondary | ICD-10-CM | POA: Diagnosis not present

## 2021-10-21 DIAGNOSIS — Z1231 Encounter for screening mammogram for malignant neoplasm of breast: Secondary | ICD-10-CM | POA: Diagnosis not present

## 2021-10-21 DIAGNOSIS — Z6841 Body Mass Index (BMI) 40.0 and over, adult: Secondary | ICD-10-CM | POA: Diagnosis not present

## 2021-10-21 DIAGNOSIS — Z124 Encounter for screening for malignant neoplasm of cervix: Secondary | ICD-10-CM | POA: Diagnosis not present

## 2021-10-21 DIAGNOSIS — N920 Excessive and frequent menstruation with regular cycle: Secondary | ICD-10-CM | POA: Diagnosis not present

## 2021-10-21 DIAGNOSIS — Z01419 Encounter for gynecological examination (general) (routine) without abnormal findings: Secondary | ICD-10-CM | POA: Diagnosis not present

## 2021-10-23 ENCOUNTER — Other Ambulatory Visit: Payer: Self-pay | Admitting: Obstetrics and Gynecology

## 2021-10-23 DIAGNOSIS — R928 Other abnormal and inconclusive findings on diagnostic imaging of breast: Secondary | ICD-10-CM

## 2021-11-01 ENCOUNTER — Ambulatory Visit
Admission: RE | Admit: 2021-11-01 | Discharge: 2021-11-01 | Disposition: A | Discharge status: Home / Self Care | Payer: BC Managed Care – PPO | Source: Ambulatory Visit | Attending: Obstetrics and Gynecology | Admitting: Obstetrics and Gynecology

## 2021-11-01 DIAGNOSIS — N6489 Other specified disorders of breast: Secondary | ICD-10-CM | POA: Diagnosis not present

## 2021-11-01 DIAGNOSIS — R928 Other abnormal and inconclusive findings on diagnostic imaging of breast: Secondary | ICD-10-CM

## 2022-02-11 DIAGNOSIS — F439 Reaction to severe stress, unspecified: Secondary | ICD-10-CM | POA: Diagnosis not present

## 2022-02-25 DIAGNOSIS — F439 Reaction to severe stress, unspecified: Secondary | ICD-10-CM | POA: Diagnosis not present

## 2022-03-11 DIAGNOSIS — F439 Reaction to severe stress, unspecified: Secondary | ICD-10-CM | POA: Diagnosis not present

## 2022-04-18 DIAGNOSIS — H6590 Unspecified nonsuppurative otitis media, unspecified ear: Secondary | ICD-10-CM | POA: Diagnosis not present

## 2022-06-23 DIAGNOSIS — R200 Anesthesia of skin: Secondary | ICD-10-CM

## 2022-06-23 HISTORY — DX: Anesthesia of skin: R20.0

## 2022-07-15 DIAGNOSIS — Z03818 Encounter for observation for suspected exposure to other biological agents ruled out: Secondary | ICD-10-CM | POA: Diagnosis not present

## 2022-07-15 DIAGNOSIS — R051 Acute cough: Secondary | ICD-10-CM | POA: Diagnosis not present

## 2022-07-15 DIAGNOSIS — J029 Acute pharyngitis, unspecified: Secondary | ICD-10-CM | POA: Diagnosis not present

## 2022-07-15 DIAGNOSIS — J069 Acute upper respiratory infection, unspecified: Secondary | ICD-10-CM | POA: Diagnosis not present

## 2022-07-29 DIAGNOSIS — F439 Reaction to severe stress, unspecified: Secondary | ICD-10-CM | POA: Diagnosis not present

## 2022-08-19 DIAGNOSIS — F439 Reaction to severe stress, unspecified: Secondary | ICD-10-CM | POA: Diagnosis not present

## 2022-09-01 DIAGNOSIS — F439 Reaction to severe stress, unspecified: Secondary | ICD-10-CM | POA: Diagnosis not present

## 2022-09-10 DIAGNOSIS — F439 Reaction to severe stress, unspecified: Secondary | ICD-10-CM | POA: Diagnosis not present

## 2022-09-18 DIAGNOSIS — F439 Reaction to severe stress, unspecified: Secondary | ICD-10-CM | POA: Diagnosis not present

## 2022-09-24 DIAGNOSIS — F439 Reaction to severe stress, unspecified: Secondary | ICD-10-CM | POA: Diagnosis not present

## 2022-10-01 DIAGNOSIS — F439 Reaction to severe stress, unspecified: Secondary | ICD-10-CM | POA: Diagnosis not present

## 2022-10-08 DIAGNOSIS — F439 Reaction to severe stress, unspecified: Secondary | ICD-10-CM | POA: Diagnosis not present

## 2022-10-15 DIAGNOSIS — F439 Reaction to severe stress, unspecified: Secondary | ICD-10-CM | POA: Diagnosis not present

## 2022-10-22 DIAGNOSIS — F439 Reaction to severe stress, unspecified: Secondary | ICD-10-CM | POA: Diagnosis not present

## 2022-10-23 DIAGNOSIS — Z0000 Encounter for general adult medical examination without abnormal findings: Secondary | ICD-10-CM | POA: Diagnosis not present

## 2022-10-23 DIAGNOSIS — Z13220 Encounter for screening for lipoid disorders: Secondary | ICD-10-CM | POA: Diagnosis not present

## 2022-10-23 DIAGNOSIS — M722 Plantar fascial fibromatosis: Secondary | ICD-10-CM | POA: Diagnosis not present

## 2022-10-28 DIAGNOSIS — Z6841 Body Mass Index (BMI) 40.0 and over, adult: Secondary | ICD-10-CM | POA: Diagnosis not present

## 2022-10-29 DIAGNOSIS — F439 Reaction to severe stress, unspecified: Secondary | ICD-10-CM | POA: Diagnosis not present

## 2022-11-06 DIAGNOSIS — F439 Reaction to severe stress, unspecified: Secondary | ICD-10-CM | POA: Diagnosis not present

## 2022-11-12 DIAGNOSIS — F439 Reaction to severe stress, unspecified: Secondary | ICD-10-CM | POA: Diagnosis not present

## 2022-11-19 DIAGNOSIS — F439 Reaction to severe stress, unspecified: Secondary | ICD-10-CM | POA: Diagnosis not present

## 2022-11-19 DIAGNOSIS — M722 Plantar fascial fibromatosis: Secondary | ICD-10-CM | POA: Diagnosis not present

## 2022-11-19 DIAGNOSIS — S70372A Other superficial bite of left thigh, initial encounter: Secondary | ICD-10-CM | POA: Diagnosis not present

## 2022-11-19 DIAGNOSIS — L299 Pruritus, unspecified: Secondary | ICD-10-CM | POA: Diagnosis not present

## 2022-11-26 DIAGNOSIS — G5781 Other specified mononeuropathies of right lower limb: Secondary | ICD-10-CM | POA: Diagnosis not present

## 2022-11-26 DIAGNOSIS — M25571 Pain in right ankle and joints of right foot: Secondary | ICD-10-CM | POA: Diagnosis not present

## 2022-11-26 DIAGNOSIS — F439 Reaction to severe stress, unspecified: Secondary | ICD-10-CM | POA: Diagnosis not present

## 2022-12-10 DIAGNOSIS — F439 Reaction to severe stress, unspecified: Secondary | ICD-10-CM | POA: Diagnosis not present

## 2022-12-17 DIAGNOSIS — F439 Reaction to severe stress, unspecified: Secondary | ICD-10-CM | POA: Diagnosis not present

## 2022-12-23 DIAGNOSIS — F439 Reaction to severe stress, unspecified: Secondary | ICD-10-CM | POA: Diagnosis not present

## 2022-12-26 DIAGNOSIS — M5417 Radiculopathy, lumbosacral region: Secondary | ICD-10-CM | POA: Diagnosis not present

## 2022-12-30 DIAGNOSIS — Z6841 Body Mass Index (BMI) 40.0 and over, adult: Secondary | ICD-10-CM | POA: Diagnosis not present

## 2022-12-30 DIAGNOSIS — Z1231 Encounter for screening mammogram for malignant neoplasm of breast: Secondary | ICD-10-CM | POA: Diagnosis not present

## 2022-12-30 DIAGNOSIS — Z01419 Encounter for gynecological examination (general) (routine) without abnormal findings: Secondary | ICD-10-CM | POA: Diagnosis not present

## 2022-12-30 DIAGNOSIS — Z124 Encounter for screening for malignant neoplasm of cervix: Secondary | ICD-10-CM | POA: Diagnosis not present

## 2023-01-08 DIAGNOSIS — F439 Reaction to severe stress, unspecified: Secondary | ICD-10-CM | POA: Diagnosis not present

## 2023-01-19 DIAGNOSIS — N924 Excessive bleeding in the premenopausal period: Secondary | ICD-10-CM | POA: Diagnosis not present

## 2023-01-19 DIAGNOSIS — N840 Polyp of corpus uteri: Secondary | ICD-10-CM | POA: Diagnosis not present

## 2023-01-21 DIAGNOSIS — F439 Reaction to severe stress, unspecified: Secondary | ICD-10-CM | POA: Diagnosis not present

## 2023-02-04 ENCOUNTER — Other Ambulatory Visit: Payer: Self-pay

## 2023-02-04 ENCOUNTER — Encounter (HOSPITAL_BASED_OUTPATIENT_CLINIC_OR_DEPARTMENT_OTHER): Payer: Self-pay | Admitting: Obstetrics and Gynecology

## 2023-02-04 DIAGNOSIS — F439 Reaction to severe stress, unspecified: Secondary | ICD-10-CM | POA: Diagnosis not present

## 2023-02-04 NOTE — Progress Notes (Signed)
Spoke w/ via phone for pre-op interview---Kendra [name] needs dos---- urine pregnancy per anesthesia, surgeon orders pending             [name] results------none COVID test -----patient states asymptomatic no test needed Arrive at -------0530 on Monday, 02/16/23 NPO after MN NO Solid Food.  Clear liquids from MN until---0430 Med rec completed Medications to take morning of surgery -----famotidine, claritin, flonase prn Diabetic medication -----n/a Patient instructed no nail polish to be worn day of surgery Patient instructed to bring photo id and insurance card day of surgery Patient aware to have Driver (ride ) / caregiver    for 24 hours after surgery- husband, Aurther Loft  Patient Special Instructions -----none Pre-Op special Instructions -----Requested orders from Dr. Rana Snare via Epic IB on 02/04/23. Patient verbalized understanding of instructions that were given at this phone interview. Patient denies shortness of breath, chest pain, fever, cough at this phone interview.

## 2023-02-16 ENCOUNTER — Encounter (HOSPITAL_BASED_OUTPATIENT_CLINIC_OR_DEPARTMENT_OTHER): Payer: Self-pay | Admitting: Obstetrics and Gynecology

## 2023-02-16 ENCOUNTER — Encounter (HOSPITAL_BASED_OUTPATIENT_CLINIC_OR_DEPARTMENT_OTHER)
Admission: RE | Disposition: A | Discharge status: Home / Self Care | Payer: Self-pay | Source: Home / Self Care | Attending: Obstetrics and Gynecology

## 2023-02-16 ENCOUNTER — Ambulatory Visit (HOSPITAL_BASED_OUTPATIENT_CLINIC_OR_DEPARTMENT_OTHER): Payer: BC Managed Care – PPO | Admitting: Certified Registered"

## 2023-02-16 ENCOUNTER — Other Ambulatory Visit: Payer: Self-pay

## 2023-02-16 ENCOUNTER — Ambulatory Visit (HOSPITAL_BASED_OUTPATIENT_CLINIC_OR_DEPARTMENT_OTHER)
Admission: RE | Admit: 2023-02-16 | Discharge: 2023-02-16 | Disposition: A | Discharge status: Home / Self Care | Payer: BC Managed Care – PPO | Attending: Obstetrics and Gynecology | Admitting: Obstetrics and Gynecology

## 2023-02-16 DIAGNOSIS — N920 Excessive and frequent menstruation with regular cycle: Secondary | ICD-10-CM | POA: Diagnosis not present

## 2023-02-16 DIAGNOSIS — Z01818 Encounter for other preprocedural examination: Secondary | ICD-10-CM

## 2023-02-16 DIAGNOSIS — Z87891 Personal history of nicotine dependence: Secondary | ICD-10-CM | POA: Insufficient documentation

## 2023-02-16 DIAGNOSIS — N858 Other specified noninflammatory disorders of uterus: Secondary | ICD-10-CM | POA: Diagnosis not present

## 2023-02-16 DIAGNOSIS — N840 Polyp of corpus uteri: Secondary | ICD-10-CM | POA: Diagnosis not present

## 2023-02-16 HISTORY — DX: Gastro-esophageal reflux disease without esophagitis: K21.9

## 2023-02-16 HISTORY — DX: Restless legs syndrome: G25.81

## 2023-02-16 HISTORY — DX: Carpal tunnel syndrome, unspecified upper limb: G56.00

## 2023-02-16 HISTORY — DX: Anemia, unspecified: D64.9

## 2023-02-16 HISTORY — DX: Palpitations: R00.2

## 2023-02-16 HISTORY — DX: Unspecified osteoarthritis, unspecified site: M19.90

## 2023-02-16 HISTORY — PX: DILITATION & CURRETTAGE/HYSTROSCOPY WITH NOVASURE ABLATION: SHX5568

## 2023-02-16 LAB — CBC
HCT: 40.6 % (ref 36.0–46.0)
Hemoglobin: 13.2 g/dL (ref 12.0–15.0)
MCH: 29.4 pg (ref 26.0–34.0)
MCHC: 32.5 g/dL (ref 30.0–36.0)
MCV: 90.4 fL (ref 80.0–100.0)
Platelets: 174 10*3/uL (ref 150–400)
RBC: 4.49 MIL/uL (ref 3.87–5.11)
RDW: 12.9 % (ref 11.5–15.5)
WBC: 6 10*3/uL (ref 4.0–10.5)
nRBC: 0 % (ref 0.0–0.2)

## 2023-02-16 LAB — POCT PREGNANCY, URINE: Preg Test, Ur: NEGATIVE

## 2023-02-16 LAB — TYPE AND SCREEN
ABO/RH(D): AB NEG
Antibody Screen: NEGATIVE

## 2023-02-16 LAB — ABO/RH: ABO/RH(D): AB NEG

## 2023-02-16 SURGERY — DILATATION & CURETTAGE/HYSTEROSCOPY WITH NOVASURE ABLATION
Anesthesia: General

## 2023-02-16 MED ORDER — FENTANYL CITRATE (PF) 100 MCG/2ML IJ SOLN
INTRAMUSCULAR | Status: DC | PRN
  Administered 2023-02-16: 25 ug via INTRAVENOUS
  Administered 2023-02-16: 50 ug via INTRAVENOUS
  Administered 2023-02-16: 25 ug via INTRAVENOUS

## 2023-02-16 MED ORDER — KETOROLAC TROMETHAMINE 30 MG/ML IJ SOLN
INTRAMUSCULAR | Status: DC | PRN
Start: 2023-02-16 — End: 2023-02-16
  Administered 2023-02-16: 30 mg via INTRAVENOUS

## 2023-02-16 MED ORDER — SODIUM CHLORIDE 0.9 % IV SOLN
2.0000 g | INTRAVENOUS | Status: AC
  Administered 2023-02-16: 2 g via INTRAVENOUS

## 2023-02-16 MED ORDER — DEXAMETHASONE SODIUM PHOSPHATE 10 MG/ML IJ SOLN
INTRAMUSCULAR | Status: AC
  Filled 2023-02-16: qty 1

## 2023-02-16 MED ORDER — MIDAZOLAM HCL 5 MG/5ML IJ SOLN
INTRAMUSCULAR | Status: DC | PRN
  Administered 2023-02-16: 2 mg via INTRAVENOUS

## 2023-02-16 MED ORDER — ONDANSETRON HCL 4 MG/2ML IJ SOLN
INTRAMUSCULAR | Status: AC
  Filled 2023-02-16: qty 2

## 2023-02-16 MED ORDER — DEXAMETHASONE SODIUM PHOSPHATE 10 MG/ML IJ SOLN
INTRAMUSCULAR | Status: DC | PRN
  Administered 2023-02-16: 10 mg via INTRAVENOUS

## 2023-02-16 MED ORDER — MIDAZOLAM HCL 2 MG/2ML IJ SOLN
INTRAMUSCULAR | Status: AC
  Filled 2023-02-16: qty 2

## 2023-02-16 MED ORDER — LACTATED RINGERS IV SOLN: INTRAVENOUS | Status: DC

## 2023-02-16 MED ORDER — PROPOFOL 10 MG/ML IV BOLUS
INTRAVENOUS | Status: DC | PRN
  Administered 2023-02-16: 170 mg via INTRAVENOUS

## 2023-02-16 MED ORDER — LIDOCAINE-EPINEPHRINE 1 %-1:100000 IJ SOLN
INTRAMUSCULAR | Status: DC | PRN
  Administered 2023-02-16: 20 mL

## 2023-02-16 MED ORDER — PROPOFOL 10 MG/ML IV BOLUS
INTRAVENOUS | Status: AC
  Filled 2023-02-16: qty 20

## 2023-02-16 MED ORDER — ONDANSETRON HCL 4 MG/2ML IJ SOLN
4.0000 mg | Freq: Once | INTRAMUSCULAR | Status: AC | PRN
  Administered 2023-02-16: 4 mg via INTRAVENOUS

## 2023-02-16 MED ORDER — ACETAMINOPHEN 10 MG/ML IV SOLN: 1000.0000 mg | Freq: Once | INTRAVENOUS | Status: DC | PRN

## 2023-02-16 MED ORDER — FENTANYL CITRATE (PF) 100 MCG/2ML IJ SOLN: 25.0000 ug | INTRAMUSCULAR | Status: DC | PRN

## 2023-02-16 MED ORDER — LIDOCAINE 2% (20 MG/ML) 5 ML SYRINGE
INTRAMUSCULAR | Status: DC | PRN
  Administered 2023-02-16: 100 mg via INTRAVENOUS

## 2023-02-16 MED ORDER — POVIDONE-IODINE 10 % EX SWAB: 2.0000 | Freq: Once | CUTANEOUS | Status: DC

## 2023-02-16 MED ORDER — KETOROLAC TROMETHAMINE 30 MG/ML IJ SOLN
INTRAMUSCULAR | Status: AC
  Filled 2023-02-16: qty 1

## 2023-02-16 MED ORDER — OXYCODONE HCL 5 MG PO TABS: 5.0000 mg | ORAL_TABLET | Freq: Once | ORAL | Status: DC | PRN

## 2023-02-16 MED ORDER — OXYCODONE-ACETAMINOPHEN 5-325 MG PO TABS: 1.0000 | ORAL_TABLET | ORAL | 0 refills | Status: AC | PRN

## 2023-02-16 MED ORDER — ONDANSETRON HCL 4 MG/2ML IJ SOLN
INTRAMUSCULAR | Status: DC | PRN
  Administered 2023-02-16: 4 mg via INTRAVENOUS

## 2023-02-16 MED ORDER — FENTANYL CITRATE (PF) 100 MCG/2ML IJ SOLN
INTRAMUSCULAR | Status: AC
  Filled 2023-02-16: qty 2

## 2023-02-16 MED ORDER — LIDOCAINE HCL (PF) 2 % IJ SOLN
INTRAMUSCULAR | Status: AC
  Filled 2023-02-16: qty 5

## 2023-02-16 MED ORDER — SODIUM CHLORIDE 0.9 % IR SOLN
Status: DC | PRN
  Administered 2023-02-16: 3000 mL

## 2023-02-16 MED ORDER — SODIUM CHLORIDE 0.9 % IV SOLN
INTRAVENOUS | Status: AC
  Filled 2023-02-16: qty 2

## 2023-02-16 MED ORDER — OXYCODONE HCL 5 MG/5ML PO SOLN: 5.0000 mg | Freq: Once | ORAL | Status: DC | PRN

## 2023-02-16 SURGICAL SUPPLY — 25 items
ABLATOR SURESOUND NOVASURE (ABLATOR) IMPLANT
CATH ROBINSON RED A/P 16FR (CATHETERS) ×1 IMPLANT
COUNTER NEEDLE 1200 MAGNETIC (NEEDLE) ×1 IMPLANT
DILATOR CANAL MILEX (MISCELLANEOUS) IMPLANT
DRSG TELFA 3X8 NADH STRL (GAUZE/BANDAGES/DRESSINGS) ×1 IMPLANT
ELECT REM PT RETURN 9FT ADLT (ELECTROSURGICAL)
ELECTRODE REM PT RTRN 9FT ADLT (ELECTROSURGICAL) IMPLANT
GAUZE 4X4 16PLY ~~LOC~~+RFID DBL (SPONGE) ×2 IMPLANT
GAUZE PETROLATUM 1 X8 (GAUZE/BANDAGES/DRESSINGS) IMPLANT
GLOVE BIO SURGEON STRL SZ8 (GLOVE) ×1 IMPLANT
GLOVE SURG ORTHO 8.0 STRL STRW (GLOVE) ×2 IMPLANT
GOWN STRL REUS W/TWL LRG LVL3 (GOWN DISPOSABLE) ×1 IMPLANT
GOWN STRL REUS W/TWL XL LVL3 (GOWN DISPOSABLE) ×1 IMPLANT
IV LACTATED RINGER IRRG 3000ML (IV SOLUTION)
IV LR IRRIG 3000ML ARTHROMATIC (IV SOLUTION) ×1 IMPLANT
KIT TURNOVER CYSTO (KITS) ×1 IMPLANT
LOOP CUTTING BIPOLAR 21FR (ELECTRODE) IMPLANT
PACK VAGINAL MINOR WOMEN LF (CUSTOM PROCEDURE TRAY) ×1 IMPLANT
PAD OB MATERNITY 4.3X12.25 (PERSONAL CARE ITEMS) ×1 IMPLANT
PAD PREP 24X48 CUFFED NSTRL (MISCELLANEOUS) ×1 IMPLANT
SEAL ROD LENS SCOPE MYOSURE (ABLATOR) ×2 IMPLANT
SLEEVE SCD COMPRESS KNEE MED (STOCKING) ×1 IMPLANT
TOWEL OR 17X24 6PK STRL BLUE (TOWEL DISPOSABLE) ×2 IMPLANT
TUBE CONNECTING 12X1/4 (SUCTIONS) IMPLANT
WATER STERILE IRR 500ML POUR (IV SOLUTION) ×1 IMPLANT

## 2023-02-16 NOTE — Discharge Instructions (Signed)
     No ibuprofen, Advil, Aleve, Motrin, ketorolac, meloxicam, naproxen, or other NSAIDS until after 2:15 pm today if needed.   Post Anesthesia Home Care Instructions  Activity: Get plenty of rest for the remainder of the day. A responsible individual must stay with you for 24 hours following the procedure.  For the next 24 hours, DO NOT: -Drive a car -Advertising copywriter -Drink alcoholic beverages -Take any medication unless instructed by your physician -Make any legal decisions or sign important papers.  Meals: Start with liquid foods such as gelatin or soup. Progress to regular foods as tolerated. Avoid greasy, spicy, heavy foods. If nausea and/or vomiting occur, drink only clear liquids until the nausea and/or vomiting subsides. Call your physician if vomiting continues.  Special Instructions/Symptoms: Your throat may feel dry or sore from the anesthesia or the breathing tube placed in your throat during surgery. If this causes discomfort, gargle with warm salt water. The discomfort should disappear within 24 hours.

## 2023-02-16 NOTE — Op Note (Unsigned)
[name], Kendra [name] MEDICAL RECORD NO: [id number] ACCOUNT NO: [id number] DATE OF BIRTH: [birth year] FACILITY: [facility] LOCATION: [facility] PHYSICIAN: Dineen Kid. Rana Snare, MD  Operative Report   DATE OF PROCEDURE: 02/16/2023  PREOPERATIVE DIAGNOSES:  Menorrhagia, abnormal uterine bleeding, and uterine polyp.  POSTOPERATIVE DIAGNOSES:  Menorrhagia, abnormal uterine bleeding, and uterine polyp.  PROCEDURE:  Hysteroscopy with dilation and curettage, polypectomy and NovaSure endometrial ablation.  SURGEON: Candice Camp, MD  ANESTHESIA:  General by LMA.  INDICATIONS:  The patient is a 42 year old, no further childbearing desires.  Her husband has had a vasectomy.  She has incapacitating menorrhagia, which lasts about 8 days, which keeps her from leaving the house for 2-3 days.  She underwent a saline  infusion ultrasound showing endometrial polyps.  She desires definitive surgical intervention, evaluation and removal of the polyp, but also wants to proceed with endometrial ablation.  We discussed the procedure at length, its risks, benefits, success  and complication rates.  She does give her informed consent and wished to proceed.  FINDINGS:  At the time of surgery were multiple small endometrial polyps, both on the anterior and posterior wall. Otherwise, normal-appearing cavity, after curettage, normal-appearing ostia and cervix, similarly after the procedure showed good thermal  cautery effect.  DESCRIPTION OF PROCEDURE:  After adequate analgesia, the patient was placed in the dorsal lithotomy position.  She was sterilely prepped and draped.  Bladder was sterilely drained.  Graves speculum was placed.  Tenaculum placed on the anterior lip of the  cervix.  Paracervical block was placed with 1% Xylocaine, 1:100,000 epinephrine, total of 20 mL used.  Uterus was sounded to 9.5 cm with a cervical length of 4 cm, giving a cavity length of 5.5 cm.  It was easily dilated to a #21 Pratt dilator.    Hysteroscope was inserted.  The above findings were noted.  Sharp curettage was performed, retrieving fragments in the endometrium as well as the endometrial polyps.  This was performed until a gritty surface was felt throughout the endometrial cavity.   Reexamination with the hysteroscope revealed a normal-appearing cavity with no residual polyps.  The NovaSure device was inserted with a cavity length 5.5 and a cavity width of 4.3, easily passed the cavity assessment test and was activated with a power  of 130 watts for 1 minute and 5 seconds.  The device was removed.  Reexamination with the hysteroscope revealed good thermal cautery effect and no obvious complications noted.  The hysteroscope was then removed.  Tenaculum removed from the cervix.  The  patient was then transferred to the recovery room in stable condition.  Sponge, needle and instrument count was normal x3.  Estimated blood loss was minimal.  The patient received 2 grams of cefotetan preoperatively.  Saline deficit was minimal.  DISPOSITION: The patient will be discharged home with followup in the office in 2-3 weeks and with routine instruction sheet for hysteroscopy, D and C.  Told to return for increased pain, fever or bleeding.  Sent home with a prescription for oxycodone  #4, to take as needed.   SHW D: 02/16/2023 8:22:15 am T: 02/16/2023 8:43:00 am  JOB: [id number]/ [id number]

## 2023-02-16 NOTE — Anesthesia Procedure Notes (Signed)
Procedure Name: LMA Insertion Date/Time: 02/16/2023 7:49 AM  Performed by: Necha Harries D, CRNAPre-anesthesia Checklist: Patient identified, Emergency Drugs available, Suction available and Patient being monitored Patient Re-evaluated:Patient Re-evaluated prior to induction Oxygen Delivery Method: Circle system utilized Preoxygenation: Pre-oxygenation with 100% oxygen Induction Type: IV induction Ventilation: Mask ventilation without difficulty LMA: LMA inserted LMA Size: 4.0 Tube type: Oral Number of attempts: 1 Placement Confirmation: positive ETCO2 and breath sounds checked- equal and bilateral Tube secured with: Tape Dental Injury: Teeth and Oropharynx as per pre-operative assessment

## 2023-02-16 NOTE — H&P (Addendum)
Kendra [name] is an 42 y.o. female presents for evaluation and treatment of menorrhagia and aub.  Her Menorrhagia so bad that her menses last 8 days and for several of these days she can not leave the house.  She underwent a SHG showing endometrial polyp.  Her husband has had a vasectomy and she has no childbearing desires.       Patient's last menstrual period was 01/19/2023 (exact date).    Past Medical History:  Diagnosis Date   Anemia    after childbirth, takes iron daily   Arthritis    right ankle   Carpal tunnel syndrome    not diagnosed, but patient suspects this is what she has in her right hand   GERD (gastroesophageal reflux disease)    Follows w/ PCP, Dr. Aliene Beams @ [place] [facility].   Numbness 2024   numbness on bottom or right foot, unknown cause   Palpitations    Patient states that she wore a heart monitor and the doctor advised her to stop caffeine and quit smoking. Symptoms improved with changes.   Restless leg syndrome     Past Surgical History:  Procedure Laterality Date   CERVIX SURGERY  2008   removal of polyps and abnormal cells   CESAREAN SECTION  2000    Family History  Problem Relation Age of Onset   Hyperlipidemia Father    Hypertension Father    Cancer Maternal Grandmother        Lung and Brain   Cancer Maternal Grandfather        lung   Hyperlipidemia Maternal Grandfather    Diabetes Paternal Grandmother    Diabetes Paternal Grandfather    Hyperlipidemia Paternal Grandfather     Social History:  reports that she quit smoking about 1 years ago. Her smoking use included cigarettes. She started smoking about 26 years ago. She has a 6.1 pack-year smoking history. She has never used smokeless tobacco. She reports current alcohol use. She reports that she does not use drugs.  Allergies:  Allergies  Allergen Reactions   Hydrocodone Nausea Only    Medications Prior to Admission  Medication Sig Dispense Refill Last Dose   famotidine  (PEPCID) 20 MG tablet Take 20 mg by mouth 2 (two) times daily.   02/16/2023 at 0415   ibuprofen (ADVIL,MOTRIN) 400 MG tablet Take 400 mg by mouth every 6 (six) hours as needed.   02/15/2023   loratadine (CLARITIN) 10 MG tablet Take 10 mg by mouth daily.   02/16/2023 at 0415   NON FORMULARY CBG 50 mg Patient states that it is like CBD that she takes daily for pain.   02/14/2023   Cholecalciferol (VITAMIN D3) 5000 units TABS 5,000 IU OTC vitamin D3 daily. 90 tablet 3 02/14/2023   fluticasone (FLONASE) 50 MCG/ACT nasal spray Place into both nostrils as needed for allergies or rhinitis.   More than a month   IRON, FERROUS SULFATE, PO Take by mouth.   02/14/2023   vitamin C (ASCORBIC ACID) 500 MG tablet Take 500 mg by mouth daily.   02/14/2023    Review of Systems  Blood pressure (!) 157/90, pulse 89, temperature 98.1 F (36.7 C), temperature source Oral, resp. rate 18, height 5\' 2"  (1.575 m), weight 121.2 kg, last menstrual period 01/19/2023, SpO2 97%. Physical Exam  Vitals and nursing note reviewed. Exam conducted with a chaperone present.  Constitutional:      Appearance: Normal appearance.  HENT:     Head:  Normocephalic.  Eyes:     Pupils: Pupils are equal, round, and reactive to light.  Cardiovascular:     Rate and Rhythm: Normal rate and regular rhythm.     Pulses: Normal pulses.  Abdominal:     General: Abdomen is Gravid, nontender Neurological:     Mental Status: She is alert. [place] Endometrial polyp Uterus AV mobile nt normal size  Results for orders placed or performed during the hospital encounter of 02/16/23 (from the past 24 hour(s))  CBC     Status: None   Collection Time: 02/16/23  5:37 AM  Result Value Ref Range   WBC 6.0 4.0 - 10.5 K/uL   RBC 4.49 3.87 - 5.11 MIL/uL   Hemoglobin 13.2 12.0 - 15.0 g/dL   HCT 40.9 81.1 - 91.4 %   MCV 90.4 80.0 - 100.0 fL   MCH 29.4 26.0 - 34.0 pg   MCHC 32.5 30.0 - 36.0 g/dL   RDW 78.2 95.6 - 21.3 %   Platelets 174 150 - 400 K/uL   nRBC  0.0 0.0 - 0.2 %  Pregnancy, urine POC     Status: None   Collection Time: 02/16/23  5:45 AM  Result Value Ref Range   Preg Test, Ur NEGATIVE NEGATIVE  Type and screen [facility]     Status: None (Preliminary result)   Collection Time: 02/16/23  6:08 AM  Result Value Ref Range   ABO/RH(D) PENDING    Antibody Screen PENDING    Sample Expiration      02/19/2023,2359 Performed at [facility], [address] [place], [place] [address]   ABO/Rh     Status: None (Preliminary result)   Collection Time: 02/16/23  6:08 AM  Result Value Ref Range   ABO/RH(D) PENDING     No results found.  Assessment/Plan: Menorrhagia, AUB, Uterine polyp Recommend Hysteroscopy, D&C possible myosure resection of polyp Discussed novasure endometrial ablation We discussed the procedure at length.  The risks and benefits and alternative treatments were discussed.  Risks include, but not limited to, injury to bowel, bladder, uterus, tubes ovaries, risks associated with possible laparotomy, blood transfusion, and infection.  All of her questions were answered and she gives informed consent.  Turner Daniels 02/16/2023, 6:48 AM  This patient has been seen and examined.   All of her questions were answered.  Labs and vital signs reviewed.  Informed consent has been obtained.  The History and Physical is current.

## 2023-02-16 NOTE — Anesthesia Preprocedure Evaluation (Addendum)
Anesthesia Evaluation  Patient identified by MRN, date of birth, ID band Patient awake    Reviewed: Allergy & Precautions, NPO status , Patient's Chart, lab work & pertinent test results, reviewed documented beta blocker date and time   Airway Mallampati: III  TM Distance: >3 FB     Dental no notable dental hx.    Pulmonary neg COPD, former smoker   breath sounds clear to auscultation       Cardiovascular (-) hypertension(-) angina (-) CAD, (-) Past MI and (-) Cardiac Stents  Rhythm:Regular Rate:Normal     Neuro/Psych neg Seizures PSYCHIATRIC DISORDERS Anxiety        GI/Hepatic ,GERD  ,,(+) neg Cirrhosis        Endo/Other    Renal/GU Renal disease     Musculoskeletal  (+) Arthritis ,    Abdominal   Peds  Hematology  (+) Blood dyscrasia, anemia   Anesthesia Other Findings   Reproductive/Obstetrics                             Anesthesia Physical Anesthesia Plan  ASA: 2  Anesthesia Plan: General   Post-op Pain Management:    Induction:   PONV Risk Score and Plan: 1 and Ondansetron  Airway Management Planned: LMA  Additional Equipment:   Intra-op Plan:   Post-operative Plan: Extubation in OR  Informed Consent: I have reviewed the patients History and Physical, chart, labs and discussed the procedure including the risks, benefits and alternatives for the proposed anesthesia with the patient or authorized representative who has indicated his/her understanding and acceptance.     Dental advisory given  Plan Discussed with: CRNA  Anesthesia Plan Comments:        Anesthesia Quick Evaluation

## 2023-02-16 NOTE — Anesthesia Postprocedure Evaluation (Signed)
Anesthesia Post Note  Patient: Kendra [name]  Procedure(s) Performed: DILATATION & CURETTAGE/HYSTEROSCOPY WITH NOVASURE ABLATION, POSSIBLE MYOSURE     Patient location during evaluation: PACU Anesthesia Type: General Level of consciousness: awake and alert Pain management: pain level controlled Vital Signs Assessment: post-procedure vital signs reviewed and stable Respiratory status: spontaneous breathing, nonlabored ventilation, respiratory function stable and patient connected to nasal cannula oxygen Cardiovascular status: blood pressure returned to baseline and stable Postop Assessment: no apparent nausea or vomiting Anesthetic complications: no   No notable events documented.  Last Vitals:  Vitals:   02/16/23 0845 02/16/23 0900  BP: 136/80 128/77  Pulse: 73 78  Resp: 18 16  Temp:    SpO2: 98% 95%    Last Pain:  Vitals:   02/16/23 0900  TempSrc:   PainSc: 0-No pain                 Mariann Barter

## 2023-02-16 NOTE — Transfer of Care (Signed)
Immediate Anesthesia Transfer of Care Note  Patient: Kendra [name]  Procedure(s) Performed: DILATATION & CURETTAGE/HYSTEROSCOPY WITH NOVASURE ABLATION, POSSIBLE MYOSURE  Patient Location: PACU  Anesthesia Type:General  Level of Consciousness: awake, alert , and oriented  Airway & Oxygen Therapy: Patient Spontanous Breathing and Patient connected to nasal cannula oxygen  Post-op Assessment: Report given to RN and Post -op Vital signs reviewed and stable  Post vital signs: Reviewed and stable  Last Vitals:  Vitals Value Taken Time  BP 140/69 02/16/23 0827  Temp    Pulse 93 02/16/23 0828  Resp 20 02/16/23 0828  SpO2 96 % 02/16/23 0828  Vitals shown include unfiled device data.  Last Pain:  Vitals:   02/16/23 0559  TempSrc: Oral  PainSc: 0-No pain      Patients Stated Pain Goal: 4 (02/16/23 0559)  Complications: No notable events documented.

## 2023-02-17 ENCOUNTER — Encounter (HOSPITAL_BASED_OUTPATIENT_CLINIC_OR_DEPARTMENT_OTHER): Payer: Self-pay | Admitting: Obstetrics and Gynecology

## 2023-02-18 DIAGNOSIS — F439 Reaction to severe stress, unspecified: Secondary | ICD-10-CM | POA: Diagnosis not present

## 2023-02-18 LAB — SURGICAL PATHOLOGY

## 2023-03-04 DIAGNOSIS — F439 Reaction to severe stress, unspecified: Secondary | ICD-10-CM | POA: Diagnosis not present

## 2023-03-18 DIAGNOSIS — F439 Reaction to severe stress, unspecified: Secondary | ICD-10-CM | POA: Diagnosis not present

## 2023-04-08 DIAGNOSIS — F439 Reaction to severe stress, unspecified: Secondary | ICD-10-CM | POA: Diagnosis not present

## 2023-04-21 IMAGING — MG MM DIGITAL DIAGNOSTIC UNILAT*R* W/ TOMO W/ CAD
4 series · 4 of 12 positions shown · non-contrast
Comparison: Previous exam(s).

CLINICAL DATA: 40-year-old female for further evaluation of
possible RIGHT breast asymmetry on screening mammogram.

EXAM:
DIGITAL DIAGNOSTIC UNILATERAL RIGHT MAMMOGRAM WITH TOMOSYNTHESIS AND
CAD; ULTRASOUND RIGHT BREAST LIMITED
TECHNIQUE: Right digital diagnostic mammography and breast tomosynthesis was
performed. The images were evaluated with computer-aided detection.;
Targeted ultrasound examination of the right breast was performed

[R MLO synth-2D]
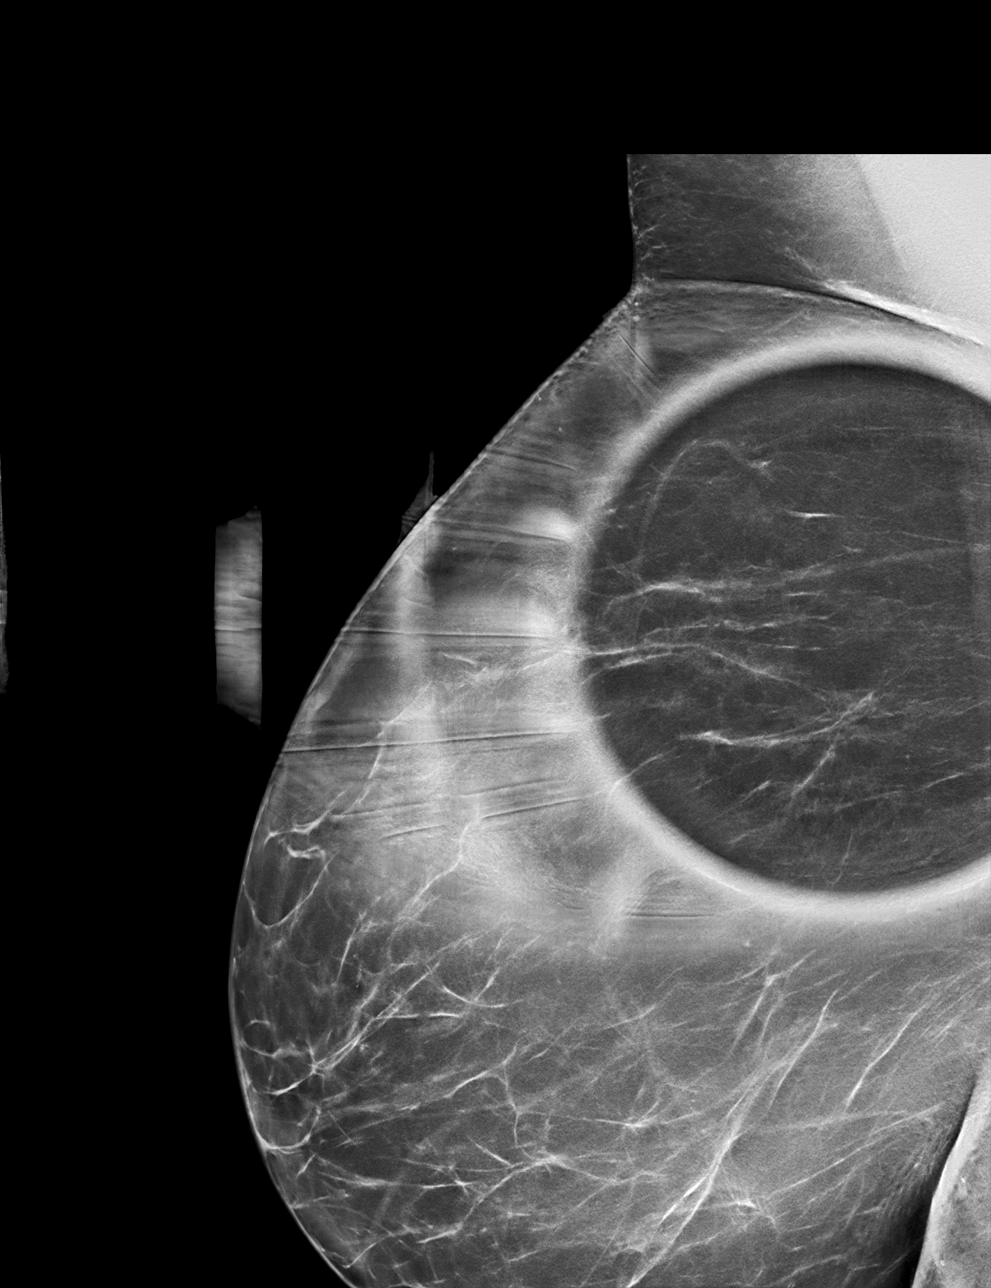

[R CC synth-2D]
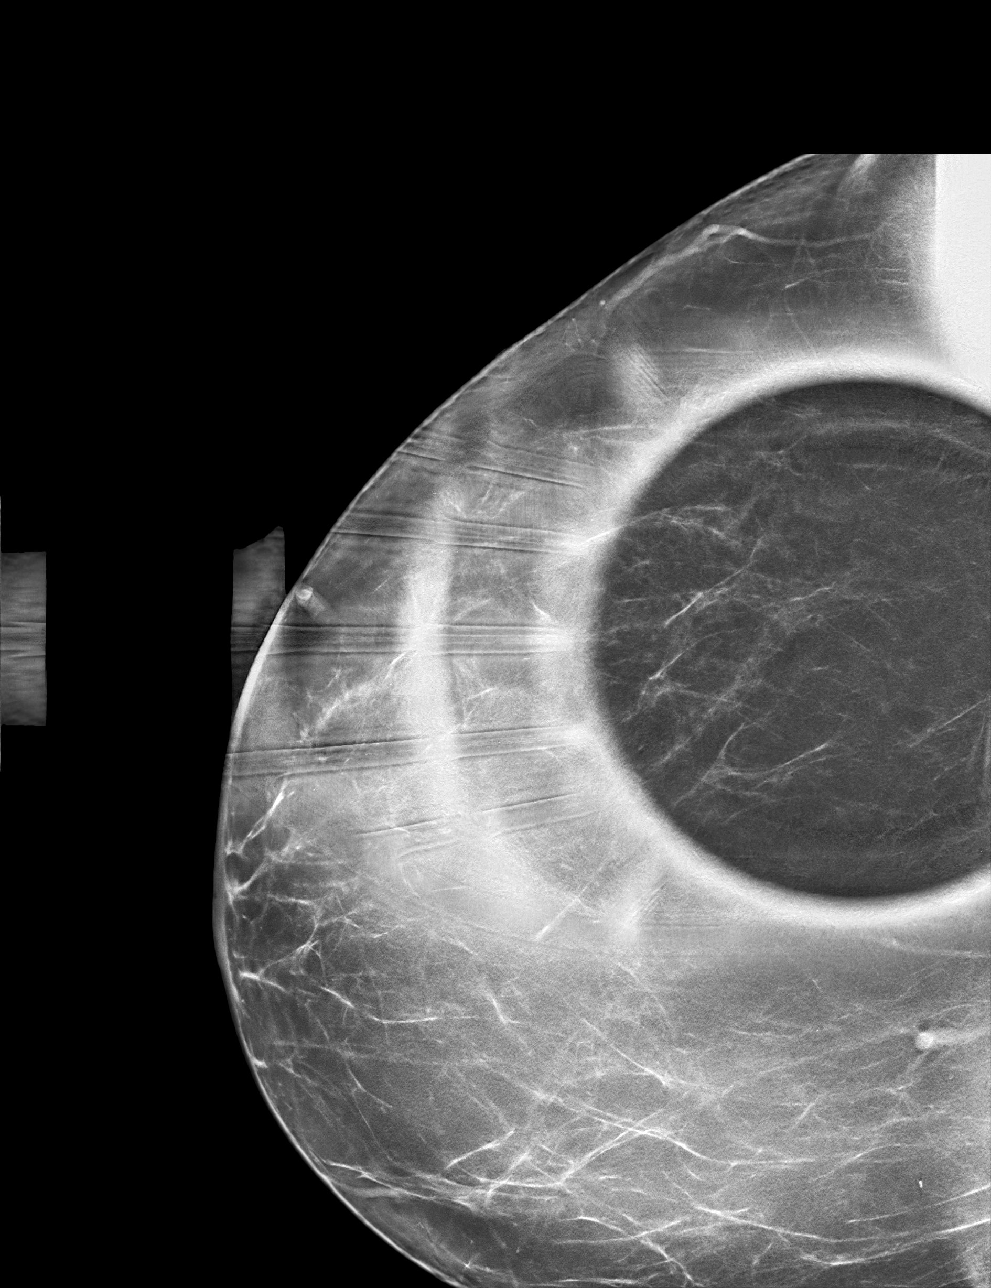

[R CC tomo · tomo slice 37/73.0]
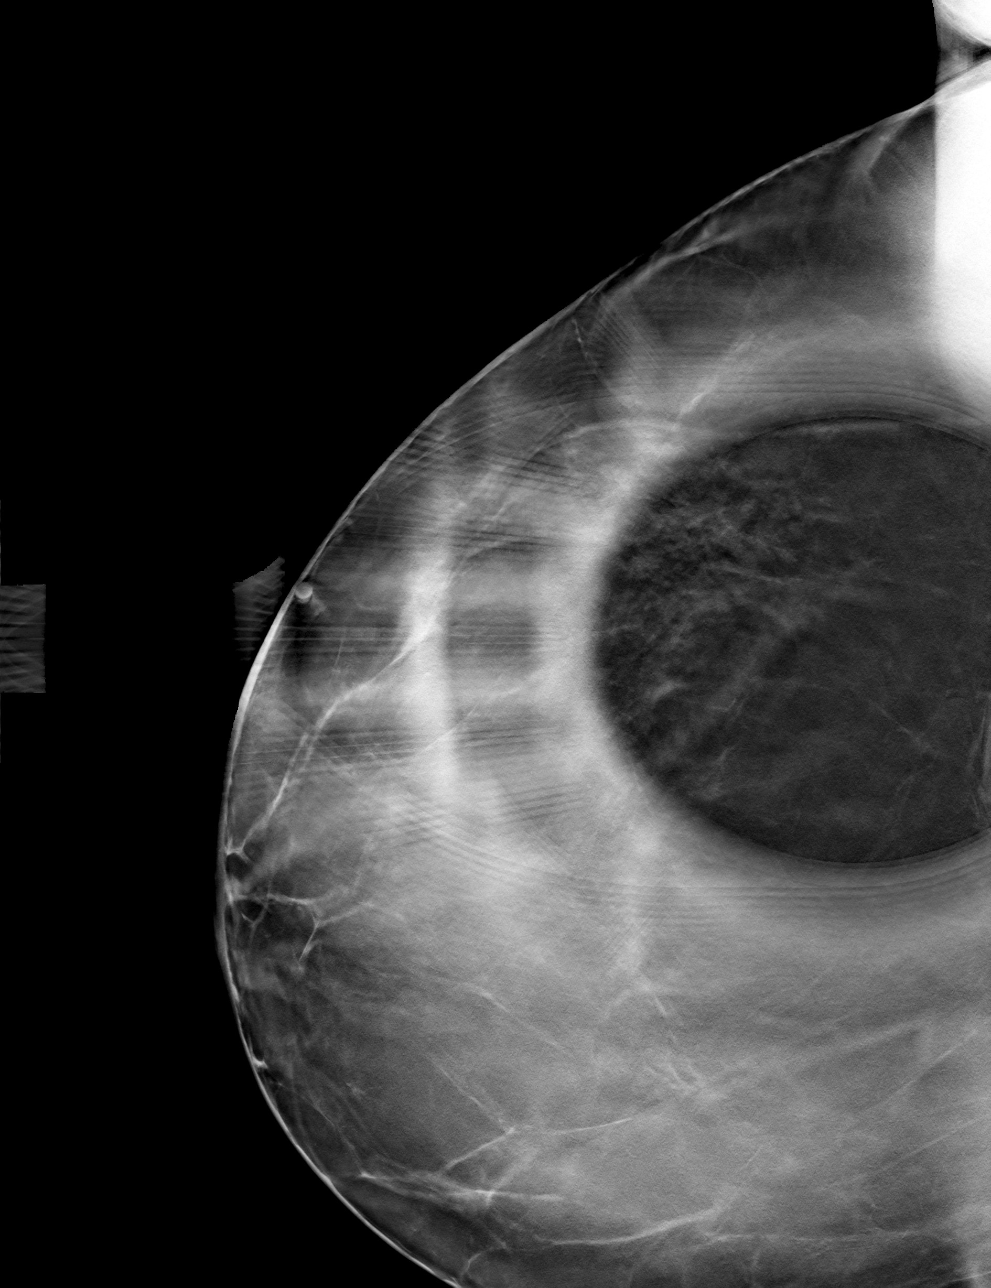

[R MLO tomo · tomo slice 43/85.0]
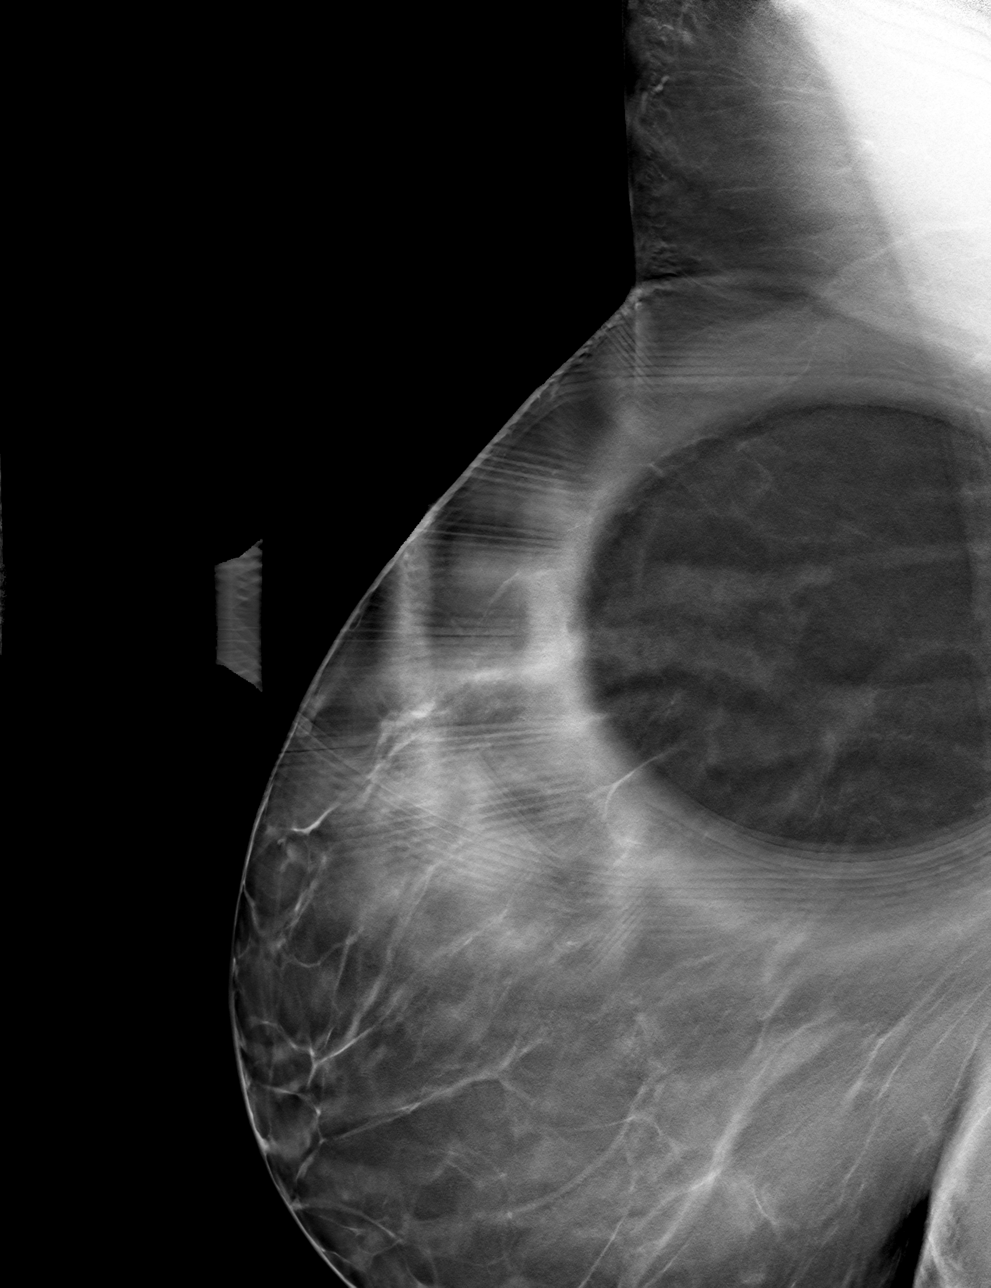

[4 of 12 positions shown; findings below may reference images not displayed]

ACR Breast Density Category b: There are scattered areas of
fibroglandular density.
FINDINGS: 2D/3D spot compression views of the RIGHT breast demonstrate no
persistent suspicious abnormality in the posterior OUTER RIGHT
breast, in the area of the screening study finding.

Targeted ultrasound is performed, showing no sonographic abnormality
within the entire OUTER RIGHT breast.
IMPRESSION: No persistent mammographic or sonographic abnormality in the area of
the screening study finding.

RECOMMENDATION:
Bilateral screening mammogram in 1 year.

I have discussed the findings and recommendations with the patient.
If applicable, a reminder letter will be sent to the patient
regarding the next appointment.

BI-RADS CATEGORY  1: Negative.

## 2023-04-30 DIAGNOSIS — F439 Reaction to severe stress, unspecified: Secondary | ICD-10-CM | POA: Diagnosis not present

## 2023-05-06 DIAGNOSIS — F439 Reaction to severe stress, unspecified: Secondary | ICD-10-CM | POA: Diagnosis not present

## 2023-05-18 DIAGNOSIS — F439 Reaction to severe stress, unspecified: Secondary | ICD-10-CM | POA: Diagnosis not present

## 2023-06-02 DIAGNOSIS — F439 Reaction to severe stress, unspecified: Secondary | ICD-10-CM | POA: Diagnosis not present

## 2023-06-10 DIAGNOSIS — J329 Chronic sinusitis, unspecified: Secondary | ICD-10-CM | POA: Diagnosis not present

## 2023-06-10 DIAGNOSIS — J302 Other seasonal allergic rhinitis: Secondary | ICD-10-CM | POA: Diagnosis not present

## 2023-06-29 DIAGNOSIS — H1031 Unspecified acute conjunctivitis, right eye: Secondary | ICD-10-CM | POA: Diagnosis not present

## 2023-07-06 DIAGNOSIS — F439 Reaction to severe stress, unspecified: Secondary | ICD-10-CM | POA: Diagnosis not present

## 2023-08-05 DIAGNOSIS — F439 Reaction to severe stress, unspecified: Secondary | ICD-10-CM | POA: Diagnosis not present

## 2023-08-19 DIAGNOSIS — F439 Reaction to severe stress, unspecified: Secondary | ICD-10-CM | POA: Diagnosis not present

## 2023-09-02 DIAGNOSIS — F439 Reaction to severe stress, unspecified: Secondary | ICD-10-CM | POA: Diagnosis not present

## 2023-09-29 DIAGNOSIS — F439 Reaction to severe stress, unspecified: Secondary | ICD-10-CM | POA: Diagnosis not present

## 2023-10-14 DIAGNOSIS — R109 Unspecified abdominal pain: Secondary | ICD-10-CM | POA: Diagnosis not present

## 2023-10-14 DIAGNOSIS — K5900 Constipation, unspecified: Secondary | ICD-10-CM | POA: Diagnosis not present

## 2023-10-14 DIAGNOSIS — R140 Abdominal distension (gaseous): Secondary | ICD-10-CM | POA: Diagnosis not present

## 2023-10-29 DIAGNOSIS — F439 Reaction to severe stress, unspecified: Secondary | ICD-10-CM | POA: Diagnosis not present

## 2023-12-10 DIAGNOSIS — F439 Reaction to severe stress, unspecified: Secondary | ICD-10-CM | POA: Diagnosis not present

## 2023-12-23 DIAGNOSIS — F439 Reaction to severe stress, unspecified: Secondary | ICD-10-CM | POA: Diagnosis not present

## 2024-01-08 DIAGNOSIS — M25571 Pain in right ankle and joints of right foot: Secondary | ICD-10-CM | POA: Diagnosis not present

## 2024-01-08 DIAGNOSIS — Z0000 Encounter for general adult medical examination without abnormal findings: Secondary | ICD-10-CM | POA: Diagnosis not present

## 2024-01-08 DIAGNOSIS — Z13220 Encounter for screening for lipoid disorders: Secondary | ICD-10-CM | POA: Diagnosis not present

## 2024-01-12 DIAGNOSIS — M25571 Pain in right ankle and joints of right foot: Secondary | ICD-10-CM | POA: Diagnosis not present

## 2024-01-19 DIAGNOSIS — Z0000 Encounter for general adult medical examination without abnormal findings: Secondary | ICD-10-CM | POA: Diagnosis not present

## 2024-01-19 DIAGNOSIS — M25571 Pain in right ankle and joints of right foot: Secondary | ICD-10-CM | POA: Diagnosis not present

## 2024-01-19 DIAGNOSIS — Z13220 Encounter for screening for lipoid disorders: Secondary | ICD-10-CM | POA: Diagnosis not present

## 2024-02-01 DIAGNOSIS — Z01419 Encounter for gynecological examination (general) (routine) without abnormal findings: Secondary | ICD-10-CM | POA: Diagnosis not present

## 2024-02-01 DIAGNOSIS — Z1231 Encounter for screening mammogram for malignant neoplasm of breast: Secondary | ICD-10-CM | POA: Diagnosis not present

## 2024-02-01 DIAGNOSIS — Z6841 Body Mass Index (BMI) 40.0 and over, adult: Secondary | ICD-10-CM | POA: Diagnosis not present

## 2024-02-01 DIAGNOSIS — R87610 Atypical squamous cells of undetermined significance on cytologic smear of cervix (ASC-US): Secondary | ICD-10-CM | POA: Diagnosis not present

## 2024-02-02 DIAGNOSIS — M25571 Pain in right ankle and joints of right foot: Secondary | ICD-10-CM | POA: Diagnosis not present
